# Patient Record
Sex: Male | Born: 1980
Health system: Southern US, Community
[De-identification: ages and names within clinical notes are randomized; demographics above are authoritative.]

## PROBLEM LIST (undated history)

## (undated) DIAGNOSIS — K219 Gastro-esophageal reflux disease without esophagitis: Secondary | ICD-10-CM

## (undated) DIAGNOSIS — H01139 Eczematous dermatitis of unspecified eye, unspecified eyelid: Secondary | ICD-10-CM

## (undated) DIAGNOSIS — T7840XA Allergy, unspecified, initial encounter: Secondary | ICD-10-CM

## (undated) HISTORY — PX: OTHER SURGICAL HISTORY: SHX169

## (undated) HISTORY — DX: Gastro-esophageal reflux disease without esophagitis: K21.9

## (undated) HISTORY — DX: Allergy, unspecified, initial encounter: T78.40XA

## (undated) HISTORY — DX: Eczematous dermatitis of unspecified eye, unspecified eyelid: H01.139

## (undated) HISTORY — PX: WISDOM TOOTH EXTRACTION: SHX21

---

## 2007-10-08 ENCOUNTER — Emergency Department (HOSPITAL_COMMUNITY): Admission: EM | Admit: 2007-10-08 | Discharge: 2007-10-08 | Payer: Self-pay | Admitting: Pediatrics

## 2010-10-07 LAB — URINE MICROSCOPIC-ADD ON

## 2010-10-07 LAB — URINALYSIS, ROUTINE W REFLEX MICROSCOPIC
Bilirubin Urine: NEGATIVE
Ketones, ur: NEGATIVE
Specific Gravity, Urine: 1.01
Urobilinogen, UA: 0.2

## 2010-10-07 LAB — DIFFERENTIAL
Eosinophils Absolute: 0.1
Eosinophils Relative: 1
Lymphocytes Relative: 8 — ABNORMAL LOW
Lymphs Abs: 1
Monocytes Absolute: 0.7
Monocytes Relative: 6
Neutrophils Relative %: 85 — ABNORMAL HIGH

## 2010-10-07 LAB — BASIC METABOLIC PANEL
BUN: 12
Calcium: 8.9
Potassium: 4.1
Sodium: 137

## 2010-10-07 LAB — CBC
HCT: 43.7
MCV: 87.9
Platelets: 192
WBC: 11.7 — ABNORMAL HIGH

## 2011-07-24 ENCOUNTER — Ambulatory Visit (INDEPENDENT_AMBULATORY_CARE_PROVIDER_SITE_OTHER): Payer: Managed Care, Other (non HMO) | Admitting: Family Medicine

## 2011-07-24 ENCOUNTER — Encounter: Payer: Self-pay | Admitting: Family Medicine

## 2011-07-24 VITALS — BP 136/74 | HR 77 | Resp 18 | Ht 70.0 in | Wt 157.0 lb

## 2011-07-24 DIAGNOSIS — Z0000 Encounter for general adult medical examination without abnormal findings: Secondary | ICD-10-CM

## 2011-07-24 DIAGNOSIS — R197 Diarrhea, unspecified: Secondary | ICD-10-CM

## 2011-07-24 DIAGNOSIS — R109 Unspecified abdominal pain: Secondary | ICD-10-CM

## 2011-07-24 DIAGNOSIS — R110 Nausea: Secondary | ICD-10-CM

## 2011-07-24 NOTE — Progress Notes (Signed)
  Subjective:    Patient ID: Joshua Ho, male    DOB: 04/23/1980, 31 y.o.   MRN: [id number]  HPI Pt here to establish care/ physical  previous PCP [facility] Medications and history reviewed He has been having nausea with dizzy episodes for the the past year, nausea  Accompanied by an "upset stomach" but no severe abdominal pain. The nausea gets so bad he becomes dizzy,no LOC, no seizure activity. He has been told this is GERD and prescribed prevacid but states he does not have heartburn. For the past 2 months he has had diarrhea, no blood in stool. No labs have every been done, no imaging. 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 He did drink and smoke > 5 years ago  TDAP- given 2005 after cut to finger   Review of Systems   GEN- denies fatigue, fever, weight loss,weakness, recent illness HEENT- denies eye drainage, change in vision, nasal discharge, CVS- denies chest pain, palpitations RESP- denies SOB, cough, wheeze ABD- + N/ no V, +change in stools, abd pain GU- denies dysuria, hematuria, dribbling, incontinence MSK- denies joint pain, muscle aches, injury Neuro- denies headache, dizziness, syncope, seizure activity      Objective:   Physical Exam  GEN- NAD, alert and oriented x3 HEENT- PERRL, EOMI, non injected sclera, pink conjunctiva, MMM, oropharynx clear Neck- Supple,  CVS- RRR, no murmur RESP-CTAB ABD-NABS,soft, NT,ND, no HSM EXT- No edema Pulses- Radial, DP- 2+ Skin- tatoo across abdomen Neuro- CNII-XII in tact, no focal deficits Psych-normal affect and Mood     Assessment & Plan:

## 2011-07-24 NOTE — Patient Instructions (Signed)
I will call with results If negative work up you will be referred to gastroenterologist Continue prevacid  F/U as needed, at least once a year for physical

## 2011-07-25 LAB — COMPREHENSIVE METABOLIC PANEL
ALT: 33 U/L (ref 0–53)
AST: 25 U/L (ref 0–37)
BUN: 15 mg/dL (ref 6–23)
Calcium: 9.5 mg/dL (ref 8.4–10.5)
Chloride: 103 mEq/L (ref 96–112)
Creat: 0.92 mg/dL (ref 0.50–1.35)
Potassium: 3.8 mEq/L (ref 3.5–5.3)
Sodium: 140 mEq/L (ref 135–145)
Total Bilirubin: 0.7 mg/dL (ref 0.3–1.2)

## 2011-07-25 LAB — CBC
MCH: 28.4 pg (ref 26.0–34.0)
MCHC: 34.7 g/dL (ref 30.0–36.0)
RBC: 5.45 MIL/uL (ref 4.22–5.81)
WBC: 11.7 10*3/uL — ABNORMAL HIGH (ref 4.0–10.5)

## 2011-07-25 LAB — LIPASE: Lipase: 36 U/L (ref 0–75)

## 2011-07-25 LAB — LIPID PANEL: VLDL: 13 mg/dL (ref 0–40)

## 2011-07-26 ENCOUNTER — Encounter: Payer: Self-pay | Admitting: Family Medicine

## 2011-07-26 DIAGNOSIS — R197 Diarrhea, unspecified: Secondary | ICD-10-CM | POA: Insufficient documentation

## 2011-07-26 DIAGNOSIS — R110 Nausea: Secondary | ICD-10-CM | POA: Insufficient documentation

## 2011-07-26 DIAGNOSIS — Z0000 Encounter for general adult medical examination without abnormal findings: Secondary | ICD-10-CM | POA: Insufficient documentation

## 2011-07-26 NOTE — Assessment & Plan Note (Signed)
He has been dealing with nausea episodes that cause him to be dizzy at times, prevacid has helped some but does not control the problem. He has some foods he is aware of to stay away from. For now will check labs, H pylori to be checked as well and lipase for pancreatitis. Without pain today, I will hold on imaging, he will likley need GI referral

## 2011-07-26 NOTE — Assessment & Plan Note (Signed)
No significant weight loss, diarrhea x 2 months, that with nausea, will need GI referral if pt agrees, no recent antibiotics to suggest infection or travel.

## 2011-07-26 NOTE — Assessment & Plan Note (Signed)
Very well appearing male, vision screen normal, TDAP UTD, obtain labs- CMET, CBC, FLP

## 2011-07-27 ENCOUNTER — Other Ambulatory Visit: Payer: Self-pay | Admitting: Family Medicine

## 2011-07-27 LAB — H. PYLORI ANTIBODY, IGG: H Pylori IgG: 1.12 {ISR} — ABNORMAL HIGH

## 2011-07-27 MED ORDER — METRONIDAZOLE 500 MG PO TABS: 500.0000 mg | ORAL_TABLET | Freq: Two times a day (BID) | ORAL | Status: AC

## 2011-07-27 MED ORDER — CLARITHROMYCIN 500 MG PO TABS: 500.0000 mg | ORAL_TABLET | Freq: Two times a day (BID) | ORAL | Status: AC

## 2011-07-28 ENCOUNTER — Ambulatory Visit: Payer: Self-pay | Admitting: Family Medicine

## 2011-07-28 ENCOUNTER — Other Ambulatory Visit: Payer: Self-pay

## 2011-07-28 MED ORDER — LANSOPRAZOLE 30 MG PO CPDR: 30.0000 mg | DELAYED_RELEASE_CAPSULE | Freq: Every day | ORAL | Status: AC

## 2011-12-28 ENCOUNTER — Telehealth: Payer: Self-pay

## 2011-12-28 MED ORDER — PREDNISONE 10 MG PO TABS: ORAL_TABLET | ORAL | Status: DC

## 2011-12-28 NOTE — Telephone Encounter (Signed)
Patient has been having swelling around his eyes - is allergic to wifes new perfume spray. Eyes swelled up and cannot miss work because he has been out since Dec 7. 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 Wants to know if there is a cream or something he can use around his eyes for this?

## 2011-12-28 NOTE — Telephone Encounter (Signed)
Pt has allergies to may perfumes, lotions etc. He is able to see, has swelling beneath eyes that happens when he comes in contact with allergens Prednisone 20mg  daily x 5 days Claritin daily

## 2011-12-28 NOTE — Telephone Encounter (Signed)
Pt aware.

## 2012-01-07 ENCOUNTER — Ambulatory Visit: Payer: Managed Care, Other (non HMO) | Admitting: Family Medicine

## 2012-01-07 ENCOUNTER — Telehealth: Payer: Self-pay | Admitting: Family Medicine

## 2012-01-07 NOTE — Telephone Encounter (Signed)
Needs appt

## 2012-01-07 NOTE — Telephone Encounter (Signed)
Pt has appt on 01/21/2012. i scheduled it with pt. He has no insurance and wife doesn't get there cards until next week. And dr. Jeanice Lim is out all next week. So i gave him the next time. Pt aware

## 2012-01-07 NOTE — Telephone Encounter (Signed)
Agree, with above, he needs to be seen

## 2012-01-21 ENCOUNTER — Ambulatory Visit (INDEPENDENT_AMBULATORY_CARE_PROVIDER_SITE_OTHER): Payer: 59 | Admitting: Family Medicine

## 2012-01-21 ENCOUNTER — Encounter: Payer: Self-pay | Admitting: Family Medicine

## 2012-01-21 VITALS — BP 124/70 | HR 100 | Resp 18 | Ht 70.0 in | Wt 162.0 lb

## 2012-01-21 DIAGNOSIS — R197 Diarrhea, unspecified: Secondary | ICD-10-CM

## 2012-01-21 DIAGNOSIS — L259 Unspecified contact dermatitis, unspecified cause: Secondary | ICD-10-CM

## 2012-01-21 MED ORDER — METHYLPREDNISOLONE ACETATE 40 MG/ML IJ SUSP
40.0000 mg | Freq: Once | INTRAMUSCULAR | Status: AC
  Administered 2012-01-21: 40 mg via INTRAMUSCULAR

## 2012-01-21 MED ORDER — LEVOCETIRIZINE DIHYDROCHLORIDE 5 MG PO TABS: 5.0000 mg | ORAL_TABLET | Freq: Every evening | ORAL | Status: DC

## 2012-01-21 MED ORDER — PREDNISONE 10 MG PO TABS: ORAL_TABLET | ORAL | Status: DC

## 2012-01-21 NOTE — Patient Instructions (Addendum)
Depo Medrol shot given Start prednisone tomorrow Start anti-histamine  Referral to the allergist  F/U in 6 months for physical

## 2012-01-22 ENCOUNTER — Encounter: Payer: Self-pay | Admitting: Family Medicine

## 2012-01-22 DIAGNOSIS — L259 Unspecified contact dermatitis, unspecified cause: Secondary | ICD-10-CM | POA: Insufficient documentation

## 2012-01-22 NOTE — Assessment & Plan Note (Signed)
He appears to be getting a contact dermatitis of some sort around the face. I will start him on xyzal and he will also be getting a dose of Depo-Medrol today followed by prednisone taper as this actually cleared up all of his symptoms a couple weeks ago. If this returns he agrees to be seen by dermatology for further testing on allergies He can use a dab of 1% hydrocortisone mixed with vaseline near the brows and above the eyes for severe itching

## 2012-01-22 NOTE — Assessment & Plan Note (Signed)
His diarrhea today is a very acute setting may be related something he will monitor this he will call if he has pain or develops any other symptoms such as fever. He would drink plenty of fluids

## 2012-01-22 NOTE — Progress Notes (Signed)
  Subjective:    Patient ID: Joshua Ho, male    DOB: February 28, 1980, 32 y.o.   MRN: [id number]  HPI  Patient presents with recurrent eyelid swelling (worse in the morning) and itching around eyes. 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 Over the past couple weeks he's had swelling around his eyelids and eyes after being in contact with various soaps and lotions. He also thought he may have been allergic to his wife's makeup as his skin is very sensitive. He's tried Benadryl at home he was even given a five-day burst of prednisone which actually cleared up all of his symptoms however they returned after coming contact with the type of face wash. In the past she's had problems with different detergents which he would get hives and bumps on his back and chest. He denies any difficulty breathing there no specific foods that cause any problems.  His previous nausea and diarrhea resolved after treatment for H. pylori he did however begin to have diarrhea about 2 hours ago after leaving the mall he is unsure if it was due to what he 8 at a [employer]. Denies any fever or abdominal pain.  Review of Systems  -  Per above    GEN- denies fatigue, fever, weight loss,weakness, recent illness HEENT- denies eye drainage, change in vision, nasal discharge, CVS- denies chest pain, palpitations RESP- denies SOB, cough, wheeze ABD- denies N/V, +change in stools, abd pain GU- denies dysuria, hematuria, dribbling, incontinence MSK- denies joint pain, muscle aches, injury Neuro- denies headache, dizziness, syncope, seizure activity      Objective:   Physical Exam  GEN- NAD, alert and oriented x3 HEENT- PERRL, EOMI, non injected sclera, pink conjunctiva, MMM, oropharynx clear,vision intact  Neck- Supple, no LAD CVS- RRR, no murmur RESP-CTAB ABD-NABS,soft,NT,ND EXT- No edema Pulses- Radial, DP- 2+ Skin- erythema with dryness across upper eyelids , no fluctuant areas, mild  swelling upper eye lid, dry skin in bearded region, drunk spared      Assessment & Plan:

## 2012-02-11 ENCOUNTER — Other Ambulatory Visit: Payer: Self-pay | Admitting: Family Medicine

## 2012-02-11 DIAGNOSIS — H01119 Allergic dermatitis of unspecified eye, unspecified eyelid: Secondary | ICD-10-CM

## 2012-02-11 DIAGNOSIS — Z9109 Other allergy status, other than to drugs and biological substances: Secondary | ICD-10-CM

## 2012-02-11 MED ORDER — OLOPATADINE HCL 0.2 % OP SOLN: OPHTHALMIC | Status: DC

## 2012-02-11 NOTE — Progress Notes (Signed)
Patient ID: Joshua Ho, male   DOB: 24-Sep-1980, 32 y.o.   MRN: [id number] Patient's wife called in his behalf he still having swelling of his eyes itching and watering. All his symptoms resolved however they return he's been taking antihistamine as well as Benadryl and this does not help in his eyes I will send him over [place] and get him to an allergist to see what is triggering his symptoms they agree with plan

## 2012-02-22 ENCOUNTER — Telehealth: Payer: Self-pay | Admitting: Family Medicine

## 2012-02-25 ENCOUNTER — Telehealth: Payer: Self-pay | Admitting: Family Medicine

## 2012-02-25 NOTE — Telephone Encounter (Signed)
Patient is aware 

## 2012-03-10 ENCOUNTER — Telehealth: Payer: Self-pay | Admitting: Family Medicine

## 2012-03-10 NOTE — Telephone Encounter (Signed)
Wanted to know if he started having to get weekly allergy shots if we could do it here because at [facility] [place] it was a $40 copay each time. I told her we could

## 2012-03-10 NOTE — Telephone Encounter (Signed)
Please call and triage call

## 2012-04-25 ENCOUNTER — Other Ambulatory Visit: Payer: Self-pay | Admitting: Family Medicine

## 2012-05-22 ENCOUNTER — Other Ambulatory Visit: Payer: Self-pay | Admitting: Family Medicine

## 2012-05-23 ENCOUNTER — Other Ambulatory Visit: Payer: Self-pay | Admitting: Family Medicine

## 2012-06-16 ENCOUNTER — Ambulatory Visit (INDEPENDENT_AMBULATORY_CARE_PROVIDER_SITE_OTHER): Payer: 59 | Admitting: Family Medicine

## 2012-06-16 DIAGNOSIS — Z23 Encounter for immunization: Secondary | ICD-10-CM

## 2012-08-18 ENCOUNTER — Telehealth: Payer: Self-pay | Admitting: Family Medicine

## 2012-08-19 MED ORDER — PANTOPRAZOLE SODIUM 40 MG PO TBEC: 40.0000 mg | DELAYED_RELEASE_TABLET | Freq: Every day | ORAL | Status: DC

## 2012-08-19 NOTE — Telephone Encounter (Signed)
Is there something else ?

## 2012-08-19 NOTE — Telephone Encounter (Signed)
Please let pt know protonix has been called, in take once a day 30 minutes before meal

## 2012-08-19 NOTE — Telephone Encounter (Signed)
Pt wife Morrie Sheldon aware

## 2012-10-07 ENCOUNTER — Telehealth: Payer: Self-pay | Admitting: Family Medicine

## 2012-10-07 ENCOUNTER — Ambulatory Visit (INDEPENDENT_AMBULATORY_CARE_PROVIDER_SITE_OTHER): Payer: 59 | Admitting: Family Medicine

## 2012-10-07 ENCOUNTER — Encounter: Payer: Self-pay | Admitting: Family Medicine

## 2012-10-07 VITALS — BP 120/80 | HR 78 | Temp 97.5°F | Resp 18 | Wt 166.0 lb

## 2012-10-07 DIAGNOSIS — J029 Acute pharyngitis, unspecified: Secondary | ICD-10-CM

## 2012-10-07 DIAGNOSIS — K120 Recurrent oral aphthae: Secondary | ICD-10-CM

## 2012-10-07 LAB — RAPID STREP SCREEN (MED CTR MEBANE ONLY): Streptococcus, Group A Screen (Direct): NEGATIVE

## 2012-10-07 MED ORDER — HYDROCODONE-ACETAMINOPHEN 5-325 MG PO TABS: 1.0000 | ORAL_TABLET | Freq: Four times a day (QID) | ORAL | Status: DC | PRN

## 2012-10-07 MED ORDER — FIRST-DUKES MOUTHWASH MT SUSP: OROMUCOSAL | Status: DC

## 2012-10-07 NOTE — Telephone Encounter (Signed)
Pt called.  Will be here 2pm per provider

## 2012-10-07 NOTE — Progress Notes (Signed)
  Subjective:    Patient ID: Joshua Ho, male    DOB: December 24, 1980, 32 y.o.   MRN: [id number]  HPI  Pt here with sore throat for past 48 hours, recently had wisdom teeth taking out was on Keflex 500mg  QID x 7 days And pain medication. Denies any fever, sinuses, URI symptoms. Very painful to swallow, feels like throat is raw. Yesterday noticed blisters on back of throat   Review of Systems  GEN- denies fatigue, fever, weight loss,weakness, recent illness HEENT- denies eye drainage, change in vision, nasal discharge,+ sore throat CVS- denies chest pain, palpitations RESP- denies SOB, cough, wheeze Neuro- denies headache, dizziness, syncope, seizure activity      Objective:   Physical Exam GEN- NAD, alert and oriented x3 HEENT- PERRL, EOMI, non injected sclera, pink conjunctiva, MMM, oropharynx +injection, with gray based shallow ulcerations on Uvula and post oropaharynx/ hard palate, no abscess seen , TM clear bilat no effusion,  No maxillary sinus tenderness Neck- Supple, no LAD CVS- RRR, no murmur RESP-CTAB EXT- No edema Pulses- Radial 2+         Assessment & Plan:

## 2012-10-07 NOTE — Telephone Encounter (Signed)
No appointments for today.  Husband is sick with severe sore throat.  Know he has Strep.  Can you call something in?

## 2012-10-07 NOTE — Patient Instructions (Signed)
You have apthous ulcers, these are benign Use the mouthwash  And pain meds Call if no improvement by Monday

## 2012-10-07 NOTE — Telephone Encounter (Signed)
He would need to be seen.he can come at 2pm

## 2012-10-09 DIAGNOSIS — K120 Recurrent oral aphthae: Secondary | ICD-10-CM | POA: Insufficient documentation

## 2012-10-09 NOTE — Assessment & Plan Note (Signed)
Strep neg Ulcerations appear to be body's recent reaction to dental surgery/stress Recent antibiotics Given dukes mouthwash and hydrocodone

## 2013-03-31 ENCOUNTER — Telehealth: Payer: Self-pay | Admitting: Family Medicine

## 2013-03-31 MED ORDER — PANTOPRAZOLE SODIUM 40 MG PO TBEC: 40.0000 mg | DELAYED_RELEASE_TABLET | Freq: Every day | ORAL | Status: DC

## 2013-03-31 NOTE — Telephone Encounter (Signed)
Refill appropriate and filled per protocol.  Call placed to patient and patient made aware per VM.

## 2013-03-31 NOTE — Telephone Encounter (Signed)
Pharmacy [facility] Call back number is [phone] Pt is needing a refill on pantoprazole (PROTONIX) 40 MG tablet

## 2013-04-06 ENCOUNTER — Other Ambulatory Visit: Payer: Self-pay | Admitting: Family Medicine

## 2013-04-06 NOTE — Telephone Encounter (Signed)
Refill appropriate and filled per protocol.  Patient is switching pharmacies.

## 2013-04-11 ENCOUNTER — Encounter: Payer: 59 | Admitting: Family Medicine

## 2013-04-26 ENCOUNTER — Encounter: Payer: Self-pay | Admitting: Family Medicine

## 2013-04-26 ENCOUNTER — Ambulatory Visit (INDEPENDENT_AMBULATORY_CARE_PROVIDER_SITE_OTHER): Payer: 59 | Admitting: Family Medicine

## 2013-04-26 VITALS — BP 108/62 | HR 76 | Temp 98.4°F | Resp 14 | Ht 69.5 in | Wt 167.0 lb

## 2013-04-26 DIAGNOSIS — K219 Gastro-esophageal reflux disease without esophagitis: Secondary | ICD-10-CM

## 2013-04-26 DIAGNOSIS — Z0000 Encounter for general adult medical examination without abnormal findings: Secondary | ICD-10-CM

## 2013-04-26 LAB — CBC WITH DIFFERENTIAL/PLATELET
Basophils Absolute: 0 10*3/uL (ref 0.0–0.1)
Basophils Relative: 1 % (ref 0–1)
EOS PCT: 3 % (ref 0–5)
Eosinophils Absolute: 0.1 10*3/uL (ref 0.0–0.7)
HEMATOCRIT: 42.8 % (ref 39.0–52.0)
Hemoglobin: 15 g/dL (ref 13.0–17.0)
LYMPHS ABS: 2 10*3/uL (ref 0.7–4.0)
LYMPHS PCT: 42 % (ref 12–46)
MCH: 28.6 pg (ref 26.0–34.0)
MCHC: 35 g/dL (ref 30.0–36.0)
MCV: 81.5 fL (ref 78.0–100.0)
MONO ABS: 0.5 10*3/uL (ref 0.1–1.0)
Monocytes Relative: 11 % (ref 3–12)
NEUTROS ABS: 2.1 10*3/uL (ref 1.7–7.7)
Neutrophils Relative %: 43 % (ref 43–77)
Platelets: 215 10*3/uL (ref 150–400)
RBC: 5.25 MIL/uL (ref 4.22–5.81)
RDW: 12.9 % (ref 11.5–15.5)
WBC: 4.8 10*3/uL (ref 4.0–10.5)

## 2013-04-26 LAB — COMPREHENSIVE METABOLIC PANEL
ALT: 28 U/L (ref 0–53)
AST: 28 U/L (ref 0–37)
Albumin: 4.4 g/dL (ref 3.5–5.2)
Alkaline Phosphatase: 54 U/L (ref 39–117)
BUN: 17 mg/dL (ref 6–23)
CALCIUM: 9.2 mg/dL (ref 8.4–10.5)
CHLORIDE: 102 meq/L (ref 96–112)
CO2: 27 mEq/L (ref 19–32)
Creat: 0.9 mg/dL (ref 0.50–1.35)
GLUCOSE: 88 mg/dL (ref 70–99)
Potassium: 3.9 mEq/L (ref 3.5–5.3)
Sodium: 139 mEq/L (ref 135–145)
TOTAL PROTEIN: 7.1 g/dL (ref 6.0–8.3)
Total Bilirubin: 0.8 mg/dL (ref 0.2–1.2)

## 2013-04-26 LAB — LIPID PANEL
CHOL/HDL RATIO: 4 ratio
Cholesterol: 169 mg/dL (ref 0–200)
HDL: 42 mg/dL (ref >39)
LDL Cholesterol: 117 mg/dL — ABNORMAL HIGH (ref 0–99)
TRIGLYCERIDES: 48 mg/dL (ref <150)
VLDL: 10 mg/dL (ref 0–40)

## 2013-04-26 MED ORDER — PANTOPRAZOLE SODIUM 40 MG PO TBEC
DELAYED_RELEASE_TABLET | ORAL | Status: DC
Start: 2013-04-26 — End: 2013-07-10

## 2013-04-26 NOTE — Patient Instructions (Signed)
I recommend eye visit once a year I recommend dental visit every 6 months Goal is to  Exercise 30 minutes 5 days a week We will send a letter with lab results  F/U 1 year

## 2013-04-26 NOTE — Progress Notes (Signed)
Patient ID: Joshua Ho, male   DOB: 07/18/1980, 33 y.o.   MRN: [id number]   Subjective:    Patient ID: Joshua Ho, male    DOB: 05/29/1980, 33 y.o.   MRN: [id number]  Patient presents for CPE  Here for complete physical exam he is a specific concerns today. He still uses a cream given by dermatology for his atopic dermatitis of the eyelids as well as over-the-counter seasonal allergy medicine. He continues to take his Protonix as needed for acid reflux. He's working on a regular basis and eating very healthy. He is due for repeat labs. He had a very minimally elevated LDL about 2 years ago. His immunizations are up-to-date   Review Of Systems:  GEN- denies fatigue, fever, weight loss,weakness, recent illness HEENT- denies eye drainage, change in vision, nasal discharge, CVS- denies chest pain, palpitations RESP- denies SOB, cough, wheeze ABD- denies N/V, change in stools, abd pain GU- denies dysuria, hematuria, dribbling, incontinence MSK- denies joint pain, muscle aches, injury Neuro- denies headache, dizziness, syncope, seizure activity       Objective:    BP 108/62  Pulse 76  Temp(Src) 98.4 F (36.9 C) (Oral)  Resp 14  Ht 5' 9.5" (1.765 m)  Wt 167 lb (75.751 kg)  BMI 24.32 kg/m2 GEN- NAD, alert and oriented x3 HEENT- PERRL, EOMI, non injected sclera, pink conjunctiva, MMM, oropharynx clear,TM Clear bilat, nares clear Neck- Supple, no LAD CVS- RRR, no murmur RESP-CTAB ABD-NABS,soft,NT,ND EXT- No edema Pulses- Radial, DP- 2+        Assessment & Plan:      Problem List Items Addressed This Visit   Routine general medical examination at a health care facility - Primary     CPE done Immunizations updated Fasting labs done    Relevant Orders      CBC with Differential      Comprehensive metabolic panel      Lipid panel   GERD (gastroesophageal reflux disease)     CONTINUE PROTONIX    Relevant Medications      pantoprazole (PROTONIX) EC tablet      Note:  This dictation was prepared with Dragon dictation along with smaller phrase technology. Any transcriptional errors that result from this process are unintentional.

## 2013-04-26 NOTE — Assessment & Plan Note (Signed)
CPE done Immunizations updated Fasting labs done

## 2013-04-26 NOTE — Assessment & Plan Note (Signed)
CONTINUE PROTONIX

## 2013-04-27 ENCOUNTER — Encounter: Payer: Self-pay | Admitting: *Deleted

## 2013-07-10 ENCOUNTER — Encounter: Payer: Self-pay | Admitting: Family Medicine

## 2013-07-10 ENCOUNTER — Ambulatory Visit (INDEPENDENT_AMBULATORY_CARE_PROVIDER_SITE_OTHER): Payer: 59 | Admitting: Family Medicine

## 2013-07-10 VITALS — BP 126/78 | HR 64 | Temp 98.2°F | Resp 12 | Ht 68.0 in | Wt 162.0 lb

## 2013-07-10 DIAGNOSIS — L239 Allergic contact dermatitis, unspecified cause: Secondary | ICD-10-CM | POA: Insufficient documentation

## 2013-07-10 DIAGNOSIS — L259 Unspecified contact dermatitis, unspecified cause: Secondary | ICD-10-CM

## 2013-07-10 DIAGNOSIS — K219 Gastro-esophageal reflux disease without esophagitis: Secondary | ICD-10-CM

## 2013-07-10 MED ORDER — RANITIDINE HCL 150 MG PO TABS: 150.0000 mg | ORAL_TABLET | Freq: Two times a day (BID) | ORAL | Status: DC

## 2013-07-10 NOTE — Patient Instructions (Signed)
Zantac 150mg  twice a day Then try the dexilant Call if you need the dermatology

## 2013-07-10 NOTE — Assessment & Plan Note (Signed)
We will give him a trial off of the Protonix, I placed him on Zantac 150 mg twice a day if he does not notice any change with this with his rash and associated does not control his acid reflux symptoms have also given him a sample of Dexilant 60mg 

## 2013-07-10 NOTE — Progress Notes (Signed)
Patient ID: Joshua Ho, male   DOB: 07/20/1980, 33 y.o.   MRN: [id number]   Subjective:    Patient ID: Joshua Ho, male    DOB: 12/19/1980, 33 y.o.   MRN: [id number]  Patient presents for Discuss medication changes  Patient here secondary to allergic dermatitis. He has a history of allergic dermatitis which starts out in his face around his eyes and the cheek area he also gets a rash on his arms. This is been going on for a little over a year. He was seen by allergy specialist who did allergy testing showed that he has severe allergy to outdoor pollens and he was given Protopic to use on his face as well as antihistamine. He is concerned that it may have something to do with his reflux medication he thought he was started on protonic around the same time. I did review his chart he was on Prevacid when he came in with the initial symptoms he was then changed to protonix back in August. He was a trial off the medication and see if that is causing his symptoms but he does need something to help with his acid reflux which is severe   Review Of Systems:  GEN- denies fatigue, fever, weight loss,weakness, recent illness HEENT- denies eye drainage, change in vision, nasal discharge, CVS- denies chest pain, palpitations RESP- denies SOB, cough, wheeze ABD- denies N/V, change in stools, abd pain GU- denies dysuria, hematuria, dribbling, incontinence MSK- denies joint pain, muscle aches, injury Neuro- denies headache, dizziness, syncope, seizure activity       Objective:    BP 126/78  Pulse 64  Temp(Src) 98.2 F (36.8 C) (Oral)  Resp 12  Ht 5\' 8"  (1.727 m)  Wt 162 lb (73.483 kg)  BMI 24.64 kg/m2 GEN- NAD, alert and oriented x3 HEENT- PERRL, EOMI, non injected sclera, pink conjunctiva, MMM, oropharynx clear Skin- mild erythema of upper eyelids and cheeks, raised erythematous raised on left forearm Pulses- Radial, DP- 2+        Assessment & Plan:      Problem List Items Addressed This  Visit   None      Note: This dictation was prepared with Dragon dictation along with smaller phrase technology. Any transcriptional errors that result from this process are unintentional.

## 2013-07-10 NOTE — Assessment & Plan Note (Signed)
I think it is reasonable to give a trial off of the proton pump inhibitor we will give him Zantac which is an H2 blocker which may help his allergic response anyway. If this does not help the next couple be dermatology he will let me know if he was proceed with this referral

## 2013-08-23 ENCOUNTER — Telehealth: Payer: Self-pay | Admitting: Family Medicine

## 2013-08-23 ENCOUNTER — Ambulatory Visit (INDEPENDENT_AMBULATORY_CARE_PROVIDER_SITE_OTHER): Payer: 59 | Admitting: Physician Assistant

## 2013-08-23 ENCOUNTER — Encounter: Payer: Self-pay | Admitting: Physician Assistant

## 2013-08-23 VITALS — BP 110/78 | HR 68 | Temp 98.2°F | Resp 14 | Ht 70.0 in | Wt 160.0 lb

## 2013-08-23 DIAGNOSIS — R002 Palpitations: Secondary | ICD-10-CM

## 2013-08-23 DIAGNOSIS — R55 Syncope and collapse: Secondary | ICD-10-CM

## 2013-08-23 LAB — CBC WITH DIFFERENTIAL/PLATELET
BASOS ABS: 0 10*3/uL (ref 0.0–0.1)
Basophils Relative: 0 % (ref 0–1)
EOS ABS: 0.2 10*3/uL (ref 0.0–0.7)
EOS PCT: 3 % (ref 0–5)
HEMATOCRIT: 43.8 % (ref 39.0–52.0)
Hemoglobin: 15.3 g/dL (ref 13.0–17.0)
LYMPHS ABS: 1.9 10*3/uL (ref 0.7–4.0)
LYMPHS PCT: 35 % (ref 12–46)
MCH: 28.9 pg (ref 26.0–34.0)
MCHC: 34.9 g/dL (ref 30.0–36.0)
MCV: 82.8 fL (ref 78.0–100.0)
MONO ABS: 0.4 10*3/uL (ref 0.1–1.0)
MONOS PCT: 8 % (ref 3–12)
NEUTROS ABS: 2.9 10*3/uL (ref 1.7–7.7)
NEUTROS PCT: 54 % (ref 43–77)
Platelets: 220 10*3/uL (ref 150–400)
RBC: 5.29 MIL/uL (ref 4.22–5.81)
RDW: 13 % (ref 11.5–15.5)
WBC: 5.3 10*3/uL (ref 4.0–10.5)

## 2013-08-23 LAB — COMPLETE METABOLIC PANEL WITH GFR
ALK PHOS: 49 U/L (ref 39–117)
ALT: 24 U/L (ref 0–53)
AST: 19 U/L (ref 0–37)
Albumin: 4.2 g/dL (ref 3.5–5.2)
BILIRUBIN TOTAL: 0.8 mg/dL (ref 0.2–1.2)
BUN: 16 mg/dL (ref 6–23)
CALCIUM: 9 mg/dL (ref 8.4–10.5)
CHLORIDE: 105 meq/L (ref 96–112)
CO2: 26 meq/L (ref 19–32)
CREATININE: 1.01 mg/dL (ref 0.50–1.35)
GFR, Est African American: >89 mL/min
GLUCOSE: 91 mg/dL (ref 70–99)
POTASSIUM: 4.3 meq/L (ref 3.5–5.3)
SODIUM: 140 meq/L (ref 135–145)
Total Protein: 7.4 g/dL (ref 6.0–8.3)

## 2013-08-23 LAB — TSH: TSH: 1.652 u[IU]/mL (ref 0.350–4.500)

## 2013-08-23 MED ORDER — PANTOPRAZOLE SODIUM 40 MG PO TBEC: 40.0000 mg | DELAYED_RELEASE_TABLET | Freq: Every day | ORAL | Status: DC

## 2013-08-23 NOTE — Telephone Encounter (Signed)
Call placed to patient.   Was advised that trial period of zantac was given to see if Protonix was casing dermatitis.   Patient states that rash like irritation continues.   Requested to resume Protonix.   MD please advise.

## 2013-08-23 NOTE — Progress Notes (Signed)
Patient ID: Phillis HaggisJason G Blatt MRN: [id number], DOB: 08/12/1980, 33 y.o. Date of Encounter: @DATE @  Chief Complaint:  Chief Complaint  Patient presents with  . Dizziness    ?' blood sugar dropping  . Nausea    HPI: 33 y.o. year old white male  presents for evaluation of the below/ following symptoms:  He says that he has been having episodes of feeling lightheaded nauseous and shaking.  Initially he he and his wife say that they think that these episodes are because of low blood sugar. However later in the conversation he reports that there are other episodes that have occurred after he has just eaten.  Then they also bring him whether these symptoms are related to his GERD and H. Pylori. I then discussed the range of symptoms that most people have with H. pylori and GERD and the fact that really faint that all of these symptoms would be explained by those kinds of things.  He reports that one of these episodes occurred yesterday morning at work. Says that he works at [employer] 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 Says that he was doing nothing physically exertional. Says that yesterday morning he woke up at 6 AM to get ready for work got to work and about 8 AM felt lightheaded shaky and nauseous. He could feel his heart racing and pounding. He does note that he had not [place] prior to the time of the episode. However he also notes that the symptoms came on all of a sudden-- not a gradual weakness that she would expect with low sugar secondary to not eating. However he says that he then did eat something and then was feeling better about 30 minutes later.  Says that [place] around 6 [place] 7 [place] Just prior to going to [employer] (about one hour hour prior to symptoms/episode) he had eaten a peanut butter and jelly sandwich. [place] he felt nauseous and lightheaded and feel his heart racing.     Past  Medical History  Diagnosis Date  . GERD (gastroesophageal reflux disease)   . Atopic dermatitis of eyelid   . Allergy      Home Meds: Outpatient Prescriptions Prior to Visit  Medication Sig Dispense Refill  . loratadine (CLARITIN) 10 MG tablet Take 10 mg by mouth daily.      . Multiple Vitamin (MULTIVITAMIN) tablet Take 1 tablet by mouth daily.      . ranitidine (ZANTAC) 150 MG tablet Take 1 tablet (150 mg total) by mouth 2 (two) times daily.  60 tablet  2  . tacrolimus (PROTOPIC) 0.03 % ointment Apply topically 2 (two) times daily.       No facility-administered medications prior to visit.    Allergies:  Allergies  Allergen Reactions  . Penicillins Rash    History   Social History  . Marital Status: Married    Spouse Name: N/A    Number of Children: N/A  . Years of Education: N/A   Occupational History  . Not on file.   Social History Main Topics  . Smoking status: Former Smoker    Quit date: 07/11/2009  . Smokeless tobacco: Former NeurosurgeonUser     Comment: quit 2011  . Alcohol Use: No  . Drug Use: No  . Sexual Activity: Yes   Other Topics Concern  . Not on file   Social History Narrative  .  No narrative on file    Family History  Problem Relation Age of Onset  . Hyperlipidemia Father      Review of Systems:  See HPI for pertinent ROS. All other ROS negative.    Physical Exam: Blood pressure 110/78, pulse 68, temperature 98.2 F (36.8 C), temperature source Oral, resp. rate 14, height 5\' 10"  (1.778 m), weight 160 lb (72.576 kg)., Body mass index is 22.96 kg/(m^2). General: WNWD WM. Appears in no acute distress. Neck: Supple. No thyromegaly. No lymphadenopathy. Lungs: Clear bilaterally to auscultation without wheezes, rales, or rhonchi. Breathing is unlabored. Heart: RRR with S1 S2. No murmurs, rubs, or gallops. Abdomen: Soft, non-tender, non-distended with normoactive bowel sounds. No hepatomegaly. No rebound/guarding. No obvious abdominal  masses. Musculoskeletal:  Strength and tone normal for age. Extremities/Skin: Warm and dry. No edema.  Neuro: Alert and oriented X 3. Moves all extremities spontaneously. Gait is normal. CNII-XII grossly in tact. Psych:  Responds to questions appropriately with a normal affect.   EKG shows normal sinus rhythm 63 beats per minute,  nonspecific ST-T change  ASSESSMENT AND PLAN:  33 y.o. year old male with  1. Pre-syncope - CBC with Differential - COMPLETE METABOLIC PANEL WITH GFR- TSH -TSH 2. Palpitations - CBC with Differential - COMPLETE METABOLIC PANEL WITH GFR - TSH  EKG obtained with findings above. Will obtain labs. I planned to obtain a Holter monitor but after the patient had left , I was informed that he deferred Holter monitor--b/c he would not be able to return monitor to our office tomorrow--secondary to his work schedule. At this point I will followup these lab results and if these are normal then I will refer him to cardiology for an event monitor.  Signed, [address] [place], [place], [facility] 08/23/2013 10:54 AM

## 2013-08-23 NOTE — Telephone Encounter (Signed)
Okay  To go back to protonix 40mg  once a day

## 2013-08-23 NOTE — Telephone Encounter (Signed)
Prescription sent to pharmacy.   Call placed to patient and patient made aware per VM. 

## 2013-08-23 NOTE — Telephone Encounter (Signed)
[phone]  Pt is needing a refill on Protonix (acid reflux medication) he use to be on it and was switched to something else but the protonix works better  Pharmacy [facility]

## 2013-08-24 ENCOUNTER — Telehealth: Payer: Self-pay | Admitting: Family Medicine

## 2013-08-24 NOTE — Telephone Encounter (Signed)
No. Sinuses would not cause the symptoms he was having. He was having presyncope feeling like he was about to pass out. Also said he was feeling "heart pounding"

## 2013-08-24 NOTE — Telephone Encounter (Signed)
Please advise 

## 2013-08-24 NOTE — Telephone Encounter (Signed)
Patient would like to know if his dizziness could be coming from a sinus infection or sinus issues by chance  He would like a call back at [phone]

## 2013-08-25 NOTE — Telephone Encounter (Signed)
Call placed to patient and patient made aware per VM.  

## 2013-08-28 ENCOUNTER — Telehealth: Payer: Self-pay | Admitting: Family Medicine

## 2013-08-28 DIAGNOSIS — K219 Gastro-esophageal reflux disease without esophagitis: Secondary | ICD-10-CM

## 2013-08-28 NOTE — Telephone Encounter (Signed)
Patient would like to go see rehman in [place] for gastroenterology for acid reflux, could we make this appt without him coming in since he was in last week?[phone]

## 2013-08-30 NOTE — Telephone Encounter (Signed)
Pt requesting to see gastroenterologist for acid reflux at [facility] office,I  put order in for referral to GI, pt was seen on 08/2013.

## 2013-09-06 ENCOUNTER — Encounter (INDEPENDENT_AMBULATORY_CARE_PROVIDER_SITE_OTHER): Payer: Self-pay | Admitting: *Deleted

## 2013-09-13 ENCOUNTER — Encounter: Payer: Self-pay | Admitting: Physician Assistant

## 2013-09-20 ENCOUNTER — Ambulatory Visit (INDEPENDENT_AMBULATORY_CARE_PROVIDER_SITE_OTHER): Payer: 59 | Admitting: Family Medicine

## 2013-09-20 ENCOUNTER — Telehealth: Payer: Self-pay | Admitting: Family Medicine

## 2013-09-20 ENCOUNTER — Encounter: Payer: Self-pay | Admitting: Family Medicine

## 2013-09-20 VITALS — BP 118/78 | HR 60 | Temp 98.3°F | Resp 14 | Ht 69.0 in | Wt 165.0 lb

## 2013-09-20 DIAGNOSIS — Z23 Encounter for immunization: Secondary | ICD-10-CM

## 2013-09-20 DIAGNOSIS — K2970 Gastritis, unspecified, without bleeding: Secondary | ICD-10-CM | POA: Insufficient documentation

## 2013-09-20 DIAGNOSIS — K2990 Gastroduodenitis, unspecified, without bleeding: Secondary | ICD-10-CM

## 2013-09-20 DIAGNOSIS — R42 Dizziness and giddiness: Secondary | ICD-10-CM | POA: Insufficient documentation

## 2013-09-20 MED ORDER — SUCRALFATE 1 GM/10ML PO SUSP: 1.0000 g | Freq: Three times a day (TID) | ORAL | Status: DC

## 2013-09-20 MED ORDER — ONDANSETRON HCL 4 MG PO TABS: 4.0000 mg | ORAL_TABLET | Freq: Three times a day (TID) | ORAL | Status: DC | PRN

## 2013-09-20 NOTE — Assessment & Plan Note (Signed)
I do not understand the correlation between his nausea and the dizziness he does note he has had a few episodes of dizziness when he did not have the nausea and abdominal pain. There is question about when he is working out when he has these episodes we did advise a Holter monitor at the last visit however he wanted to hold off his labs were unremarkable including a TSH. Date most of his issues have to do with his abdomen may be some anxiety that is causing the other symptoms however if that does not pan out I would recommend that he have a least a Holter monitor done

## 2013-09-20 NOTE — Patient Instructions (Signed)
Try the  carafate with meals as needed for pain  Zofran for nausea Next step is monitor if nothing can be found with your stomach Flu shot given F/U as needed

## 2013-09-20 NOTE — Telephone Encounter (Signed)
Lupita Leash from dr setzer's office calling to speak with you  Regarding this patient  [phone]

## 2013-09-20 NOTE — Progress Notes (Signed)
Patient ID: Joshua Ho, male   DOB: 04-Jul-1980, 33 y.o.   MRN: [id number]   Subjective:    Patient ID: Joshua Ho, male    DOB: [birth year], 33 y.o.   MRN: [id number]  Patient presents for 4 week F/U  patient here for interim followup ear he was seen 4 weeks ago at that time was having nausea and dizzy spells typically they occur together he's even happen while working out. He thought it was due to the change in his proton pump inhibitor I changed him to Dexilant a couple of months ago he was previously on protoniX. he states that he feels like he did when he had H. pylori which I treated over a year ago. He denies any actual chest pain or palpitations he states that he didn't fill his heart racing one time he almost passed out. His wife is here today to states one episode occurred he had not had anything to eat for about 6 hours and he had been working out. He has discomfort when he eats therefore she's been eating very small meals. He does have appointment scheduled for gastroenterology in October. He has not had any change in his bowels no change emesis. No shortness of breath. He has felt better since he's been back on the protonix    Review Of Systems:  GEN- denies fatigue, fever, weight loss,weakness, recent illness HEENT- denies eye drainage, change in vision, nasal discharge, CVS- denies chest pain, palpitations RESP- denies SOB, cough, wheeze ABD- +N/ denies V, change in stools, +abd pain GU- denies dysuria, hematuria, dribbling, incontinence MSK- denies joint pain, muscle aches, injury Neuro- denies headache, +dizziness, syncope, seizure activity       Objective:    BP 118/78  Pulse 60  Temp(Src) 98.3 F (36.8 C) (Oral)  Resp 14  Ht  (1.753 m)  Wt 165 lb (74.844 kg)  BMI 24.36 kg/m2 GEN- NAD, alert and oriented x3 HEENT- PERRL, EOMI, non injected sclera, pink conjunctiva, MMM, oropharynx clear CVS- RRR, no murmur RESP-CTAB ABD-NABS,soft,NT,ND Neuro-CNII-XII in  tact no deificts EXT- No edema Pulses- Radial 2+        Assessment & Plan:      Problem List Items Addressed This Visit   None      Note: This dictation was prepared with Dragon dictation along with smaller phrase technology. Any transcriptional errors that result from this process are unintentional.

## 2013-09-20 NOTE — Assessment & Plan Note (Signed)
He was treated for H. Pylori back at the end of 2013 I will put him on sucralfate with meals hopefully this will help with his nausea and her appetite we will try to get up earlier GI appointment I think he would benefit from an EGD, to rule out ulcer

## 2013-09-20 NOTE — Telephone Encounter (Signed)
lmtrc

## 2013-10-09 ENCOUNTER — Ambulatory Visit (INDEPENDENT_AMBULATORY_CARE_PROVIDER_SITE_OTHER): Payer: 59 | Admitting: Internal Medicine

## 2013-10-09 ENCOUNTER — Encounter (INDEPENDENT_AMBULATORY_CARE_PROVIDER_SITE_OTHER): Payer: Self-pay | Admitting: *Deleted

## 2013-10-09 ENCOUNTER — Other Ambulatory Visit (INDEPENDENT_AMBULATORY_CARE_PROVIDER_SITE_OTHER): Payer: Self-pay | Admitting: *Deleted

## 2013-10-09 ENCOUNTER — Encounter (INDEPENDENT_AMBULATORY_CARE_PROVIDER_SITE_OTHER): Payer: Self-pay | Admitting: Internal Medicine

## 2013-10-09 VITALS — BP 112/60 | HR 60 | Temp 97.6°F | Ht 70.0 in | Wt 165.0 lb

## 2013-10-09 DIAGNOSIS — K219 Gastro-esophageal reflux disease without esophagitis: Secondary | ICD-10-CM

## 2013-10-09 NOTE — Progress Notes (Signed)
Subjective:    Patient ID: Joshua Ho, male    DOB: 1980/07/11, 33 y.o.   MRN: [id number]  HPI  Referred to our office by Dr. Buelah Manis for GERD/EGD. He says he has nausea and sometimes he becomes swimmy headed. He says it is his stomach. If he burps the dizziness resolves. He also c/o some burning in his epigastric region.  He was switched to Zantac 1 1/2 months ago, and taken off the Protonix. They thought maybe the Protonix was breaking her eyelids out. He started the Protonix again 3 weeks ago.  He says his symptoms of swimmy headedness is much better. He still has some nausea.  Has been on Protonix x 2 yrs. He is 85% better.  He still has some nausea and burning in his epigastric region. Onions and caffeine, and coffee aggravate his symptoms (nausea).  He avoid spicy foods because he knows they will bother him.  Appetite is good. No weight loss.  Usually has a BM daily in the am.  Stools are formed but really not solids.  No melena or BRRB. Hx of H.pylori and treated in 2013. States the Carafate is helping. No NSAIDs. .     07/24/2011 H. Pylori positive: Treated with Amoxicillin, Clarithromycin, and Prevacid BID x 10 days.  CBC    Component Value Date/Time   WBC 5.3 08/23/2013 0851   RBC 5.29 08/23/2013 0851   HGB 15.3 08/23/2013 0851   HCT 43.8 08/23/2013 0851   PLT 220 08/23/2013 0851   MCV 82.8 08/23/2013 0851   MCH 28.9 08/23/2013 0851   MCHC 34.9 08/23/2013 0851   RDW 13.0 08/23/2013 0851   LYMPHSABS 1.9 08/23/2013 0851   MONOABS 0.4 08/23/2013 0851   EOSABS 0.2 08/23/2013 0851   BASOSABS 0.0 08/23/2013 0851    CMP     Component Value Date/Time   NA 140 08/23/2013 0851   K 4.3 08/23/2013 0851   CL 105 08/23/2013 0851   CO2 26 08/23/2013 0851   GLUCOSE 91 08/23/2013 0851   BUN 16 08/23/2013 0851   CREATININE 1.01 08/23/2013 0851   CREATININE 1.11 10/08/2007 1940   CALCIUM 9.0 08/23/2013 0851   PROT 7.4 08/23/2013 0851   ALBUMIN 4.2 08/23/2013 0851   AST 19 08/23/2013 0851   ALT 24 08/23/2013 0851   ALKPHOS 49 08/23/2013 0851   BILITOT 0.8 08/23/2013 0851   GFRNONAA >89 08/23/2013 0851   GFRNONAA >60 10/08/2007 1940   GFRAA >89 08/23/2013 0851   GFRAA  Value: >60        The eGFR has been calculated using the MDRD equation. This calculation has not been validated in all clinical 10/08/2007 1940      Review of Systems Past Medical History  Diagnosis Date  . GERD (gastroesophageal reflux disease)   . Atopic dermatitis of eyelid   . Allergy     Past Surgical History  Procedure Laterality Date  . None    . Wisdom tooth extraction      x 4    Allergies  Allergen Reactions  . Penicillins Rash    Current Outpatient Prescriptions on File Prior to Visit  Medication Sig Dispense Refill  . loratadine (CLARITIN) 10 MG tablet Take 10 mg by mouth daily as needed.       . Multiple Vitamin (MULTIVITAMIN) tablet Take 1 tablet by mouth daily.      . ondansetron (ZOFRAN) 4 MG tablet Take 1 tablet (4 mg total) by mouth every 8 (eight)  hours as needed for nausea or vomiting.  20 tablet  1  . pantoprazole (PROTONIX) 40 MG tablet Take 1 tablet (40 mg total) by mouth daily.  30 tablet  3  . sucralfate (CARAFATE) 1 GM/10ML suspension Take 10 mLs (1 g total) by mouth 4 (four) times daily -  with meals and at bedtime.  420 mL  0  . tacrolimus (PROTOPIC) 0.03 % ointment Apply topically 2 (two) times daily.       No current facility-administered medications on file prior to visit.        Objective:   Physical Exam  Filed Vitals:   10/09/13 1501  BP: 112/60  Pulse: 60  Temp: 97.6 F (36.4 C)  Height: 5' 10"  (1.778 m)  Weight: 165 lb (74.844 kg)   Alert and oriented. Skin warm and dry. Oral mucosa is moist.   . Sclera anicteric, conjunctivae is pink. Thyroid not enlarged. No cervical lymphadenopathy. Lungs clear. Heart regular rate and rhythm.  Abdomen is soft. Bowel sounds are positive. No hepatomegaly. No abdominal masses felt. No tenderness.  No edema to lower  extremities.         Assessment & Plan:   GERD: PUD needs to be ruled out. Hx of H. Pylori positive. EGD IF EGD normal, GB disease needs to be ruled out.

## 2013-10-09 NOTE — Patient Instructions (Signed)
EGD. The risks and benefits such as perforation, bleeding, and infection were reviewed with the patient and is agreeable. 

## 2013-10-11 ENCOUNTER — Encounter (HOSPITAL_COMMUNITY): Payer: Self-pay | Admitting: Pharmacy Technician

## 2013-10-20 ENCOUNTER — Ambulatory Visit (HOSPITAL_COMMUNITY)
Admission: RE | Admit: 2013-10-20 | Discharge: 2013-10-20 | Disposition: A | Discharge status: Home / Self Care | Payer: 59 | Source: Ambulatory Visit | Attending: Internal Medicine | Admitting: Internal Medicine

## 2013-10-20 ENCOUNTER — Encounter (HOSPITAL_COMMUNITY): Payer: Self-pay | Admitting: *Deleted

## 2013-10-20 ENCOUNTER — Encounter (HOSPITAL_COMMUNITY)
Admission: RE | Disposition: A | Discharge status: Home / Self Care | Payer: Self-pay | Source: Ambulatory Visit | Attending: Internal Medicine

## 2013-10-20 DIAGNOSIS — R42 Dizziness and giddiness: Secondary | ICD-10-CM | POA: Insufficient documentation

## 2013-10-20 DIAGNOSIS — R110 Nausea: Secondary | ICD-10-CM | POA: Insufficient documentation

## 2013-10-20 DIAGNOSIS — R12 Heartburn: Secondary | ICD-10-CM

## 2013-10-20 DIAGNOSIS — Z79899 Other long term (current) drug therapy: Secondary | ICD-10-CM | POA: Insufficient documentation

## 2013-10-20 DIAGNOSIS — Z87891 Personal history of nicotine dependence: Secondary | ICD-10-CM | POA: Diagnosis not present

## 2013-10-20 DIAGNOSIS — K219 Gastro-esophageal reflux disease without esophagitis: Secondary | ICD-10-CM | POA: Insufficient documentation

## 2013-10-20 DIAGNOSIS — K228 Other specified diseases of esophagus: Secondary | ICD-10-CM

## 2013-10-20 HISTORY — PX: ESOPHAGOGASTRODUODENOSCOPY: SHX5428

## 2013-10-20 SURGERY — EGD (ESOPHAGOGASTRODUODENOSCOPY)
Anesthesia: Moderate Sedation

## 2013-10-20 MED ORDER — MIDAZOLAM HCL 5 MG/5ML IJ SOLN
INTRAMUSCULAR | Status: AC
  Filled 2013-10-20: qty 5

## 2013-10-20 MED ORDER — MIDAZOLAM HCL 5 MG/5ML IJ SOLN
INTRAMUSCULAR | Status: DC | PRN
  Administered 2013-10-20: 2 mg via INTRAVENOUS
  Administered 2013-10-20 (×2): 3 mg via INTRAVENOUS
  Administered 2013-10-20: 2 mg via INTRAVENOUS
  Administered 2013-10-20 (×2): 3 mg via INTRAVENOUS
  Administered 2013-10-20: 2 mg via INTRAVENOUS

## 2013-10-20 MED ORDER — MEPERIDINE HCL 50 MG/ML IJ SOLN
INTRAMUSCULAR | Status: AC
  Filled 2013-10-20: qty 1

## 2013-10-20 MED ORDER — MEPERIDINE HCL 50 MG/ML IJ SOLN
INTRAMUSCULAR | Status: DC | PRN
  Administered 2013-10-20 (×2): 25 mg via INTRAVENOUS

## 2013-10-20 MED ORDER — MIDAZOLAM HCL 5 MG/5ML IJ SOLN
INTRAMUSCULAR | Status: AC
  Filled 2013-10-20: qty 10

## 2013-10-20 MED ORDER — STERILE WATER FOR IRRIGATION IR SOLN
Status: DC | PRN
  Administered 2013-10-20: 09:00:00

## 2013-10-20 MED ORDER — SODIUM CHLORIDE 0.9 % IV SOLN
INTRAVENOUS | Status: DC
Start: 2013-10-20 — End: 2013-10-20
  Administered 2013-10-20: 08:00:00 via INTRAVENOUS

## 2013-10-20 MED ORDER — SUCRALFATE 1 GM/10ML PO SUSP: 2.0000 g | Freq: Every day | ORAL | Status: DC

## 2013-10-20 MED ORDER — BUTAMBEN-TETRACAINE-BENZOCAINE 2-2-14 % EX AERO
INHALATION_SPRAY | CUTANEOUS | Status: DC | PRN
  Administered 2013-10-20: 2 via TOPICAL

## 2013-10-20 NOTE — Discharge Instructions (Signed)
Resume usual medications as before but take sucralfate 2 g daily at bedtime. Resume usual diet. No driving for 24 hours. Physician will call with biopsy results. Keep symptom diary until office visit in 2 months.    Esophagogastroduodenoscopy Care After Refer to this sheet in the next few weeks. These instructions provide you with information on caring for yourself after your procedure. Your caregiver may also give you more specific instructions. Your treatment has been planned according to current medical practices, but problems sometimes occur. Call your caregiver if you have any problems or questions after your procedure.  HOME CARE INSTRUCTIONS  Do not eat or drink anything until the numbing medicine (local anesthetic) has worn off and your gag reflex has returned. You will know that the local anesthetic has worn off when you can swallow comfortably.  Do not drive for 12 hours after the procedure or as directed by your caregiver.  Only take medicines as directed by your caregiver. SEEK MEDICAL CARE IF:   You cannot stop coughing.  You are not urinating at all or less than usual. SEEK IMMEDIATE MEDICAL CARE IF:  You have difficulty swallowing.  You cannot eat or drink.  You have worsening throat or chest pain.  You have dizziness, lightheadedness, or you faint.  You have nausea or vomiting.  You have chills.  You have a fever.  You have severe abdominal pain.  You have black, tarry, or bloody stools. Document Released: 12/09/2011 Document Reviewed: 12/09/2011 [facility] Patient Information 2015 [place], [place]. This information is not intended to replace advice given to you by your health care provider. Make sure you discuss any questions you have with your health care provider.

## 2013-10-20 NOTE — Op Note (Signed)
EGD PROCEDURE REPORT  PATIENT:  Joshua Ho  MR#:  [id number] Birthdate:  04/03/1980, 33 y.o., male Endoscopist:  Dr. Malissa HippoNajeeb U. Rehman, MD Referred By:  Dr. Milinda AntisKawanta Cogswell, MD  Procedure Date: 10/20/2013  Procedure:   EGD  Indications:  Patient is a 33 year old Caucasian male with two-year history of intermittent nausea, sporadic heartburn and lightheadedness associated with nausea. He is feeling much better with PPI and sucralfate. He is undergoing diagnostic EGD.            Informed Consent:  The risks, benefits, alternatives & imponderables which include, but are not limited to, bleeding, infection, perforation, drug reaction and potential missed lesion have been reviewed.  The potential for biopsy, lesion removal, esophageal dilation, etc. have also been discussed.  Questions have been answered.  All parties agreeable.  Please see history & physical in medical record for more information.  Medications:  Demerol 50 mg IV Versed 18 mg IV Cetacaine spray topically for oropharyngeal anesthesia  Description of procedure:  The endoscope was introduced through the mouth and advanced to the second portion of the duodenum without difficulty or limitations. The mucosal surfaces were surveyed very carefully during advancement of the scope and upon withdrawal.  Findings:  Esophagus:  Mucosa of the esophagus was normal. GE junction was wavy or with focal sawtooth appearance. No erosions or ulcers noted. GEJ:  41 cm Stomach:  Stomach was empty and distended very well with insufflation. Folds in the proximal stomach were normal. Examination of mucosa at body, antrum, pyloric channel, angularis fundus and cardia was normal. Duodenum:  Normal bulbar and post bulbar mucosa.  Therapeutic/Diagnostic Maneuvers Performed:   Biopsy taken from GE junction, from the area of the sawtooth appearance.  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 Biopsy taken to rule out  Barretts esophagus. No evidence of erosive reflux esophagitis, gastritis or peptic ulcer disease.  Recommendations:  Continue anti-reflex measures. Continue pantoprazole at 40 mg by mouth every morning. Decrease sucralfate to 2 g by mouth each bedtime. I will be contacting patient with biopsy results. Patient will keep symptom diary until  office visit in 2 months.  REHMAN,NAJEEB U  10/20/2013  9:30 AM  CC: Dr. Milinda AntisURHAM, KAWANTA, MD & Dr. Bonnetta BarryNo ref. provider found

## 2013-10-20 NOTE — H&P (Signed)
Joshua HaggisJason G Ho is an 33 y.o. male.   Chief Complaint: Patient is here for EGD. HPI: Patient is 33 year old Caucasian male presents with two-year history of intermittent nausea associated with lightheadedness. He's never passed out. He has heartburn which is less frequent. He denies vomiting melena anorexia or weight loss. He has sporadic abdominal pain. Patient was initially on a PPI which was discontinued because there was question of allergies he developed intractable symptoms and he is back on PPI and a lot better. He does not take NSAIDs. He was treated for H. pylori in July 2013.  Past Medical History  Diagnosis Date  . GERD (gastroesophageal reflux disease)   . Atopic dermatitis of eyelid   . Allergy     Past Surgical History  Procedure Laterality Date  . None    . Wisdom tooth extraction      x 4    Family History  Problem Relation Age of Onset  . Hyperlipidemia Father    Social History:  reports that he quit smoking about 4 years ago. His smoking use included Cigarettes. He has a 4.5 pack-year smoking history. He has quit using smokeless tobacco. He reports that he does not drink alcohol or use illicit drugs.  Allergies:  Allergies  Allergen Reactions  . Penicillins Rash    Medications Prior to Admission  Medication Sig Dispense Refill  . ondansetron (ZOFRAN) 4 MG tablet Take 1 tablet (4 mg total) by mouth every 8 (eight) hours as needed for nausea or vomiting.  20 tablet  1  . pantoprazole (PROTONIX) 40 MG tablet Take 1 tablet (40 mg total) by mouth daily.  30 tablet  3  . sucralfate (CARAFATE) 1 GM/10ML suspension Take 10 mLs (1 g total) by mouth 4 (four) times daily -  with meals and at bedtime.  420 mL  0  . loratadine (CLARITIN) 10 MG tablet Take 10 mg by mouth daily as needed.       . Multiple Vitamin (MULTIVITAMIN) tablet Take 1 tablet by mouth daily.      . tacrolimus (PROTOPIC) 0.03 % ointment Apply 1 application topically 2 (two) times daily as needed.          No results found for this or any previous visit (from the past 48 hour(s)). No results found.  ROS  Blood pressure 132/75, pulse 73, temperature 97.9 F (36.6 C), temperature source Oral, resp. rate 14, height 5\' 10"  (1.778 m), weight 165 lb (74.844 kg), SpO2 100.00%. Physical Exam  Constitutional: He appears well-developed and well-nourished.  HENT:  Mouth/Throat: Oropharynx is clear and moist.  Eyes: Conjunctivae are normal. No scleral icterus.  Neck: No thyromegaly present.  Cardiovascular: Normal rate, regular rhythm and normal heart sounds.   No murmur heard. Respiratory: Effort normal and breath sounds normal.  GI: Soft. He exhibits no distension and no mass. There is no tenderness.  Musculoskeletal: He exhibits no edema.  Lymphadenopathy:    He has no cervical adenopathy.  Neurological: He is alert.  Skin: Skin is warm and dry.     Assessment/Plan Recurrent nausea and intermittent heartburn. Nausea associated with lightheadedness possibly vasovagal syndrome. Diagnostic EGD.  Tais Koestner U 10/20/2013, 8:49 AM

## 2013-10-25 ENCOUNTER — Encounter (HOSPITAL_COMMUNITY): Payer: Self-pay | Admitting: Internal Medicine

## 2013-11-03 ENCOUNTER — Encounter (INDEPENDENT_AMBULATORY_CARE_PROVIDER_SITE_OTHER): Payer: Self-pay | Admitting: *Deleted

## 2013-12-04 ENCOUNTER — Ambulatory Visit (INDEPENDENT_AMBULATORY_CARE_PROVIDER_SITE_OTHER): Payer: 59 | Admitting: Internal Medicine

## 2014-01-15 ENCOUNTER — Encounter (INDEPENDENT_AMBULATORY_CARE_PROVIDER_SITE_OTHER): Payer: Self-pay | Admitting: Internal Medicine

## 2014-01-15 ENCOUNTER — Ambulatory Visit (INDEPENDENT_AMBULATORY_CARE_PROVIDER_SITE_OTHER): Payer: 59 | Admitting: Internal Medicine

## 2014-01-15 VITALS — BP 122/74 | HR 68 | Temp 98.6°F | Resp 18 | Ht 70.0 in | Wt 168.5 lb

## 2014-01-15 DIAGNOSIS — K2100 Gastro-esophageal reflux disease with esophagitis, without bleeding: Secondary | ICD-10-CM

## 2014-01-15 NOTE — Patient Instructions (Signed)
Can use Gaviscon on as-needed basis. Notify if symptoms relapse.

## 2014-01-15 NOTE — Progress Notes (Signed)
Presenting complaint;  Follow-up for GERD.  Database;  Patient is 34 year old Caucasian male who presents for scheduled visit accompanied by his wife. Last year he was evaluated for intermittent nausea and heartburn epigastric pain and lightheadedness which usually was associated with nausea. He underwent EGD on 10/20/2013 revealing sawtooth appearance to GE junction. Biopsy was negative for Barrett's but did show changes of reflux esophagitis. Patient was advised to discontinue sucralfate but continue pantoprazole and use Gaviscon in the evening.  Subjective;  He feels much better. He rarely has heartburn and epigastric pain nausea or lightheadedness. He has changed his eating habits. He does not eat pizza or fried food often. He is not having any side effects with pantoprazole. His bowels move regularly and he denies melena or rectal bleeding.  Current Medications: Outpatient Encounter Prescriptions as of 01/15/2014  Medication Sig  . Alum Hydroxide-Mag Carbonate (GAVISCON PO) Take by mouth at bedtime.  Marland Kitchen. loratadine (CLARITIN) 10 MG tablet Take 10 mg by mouth daily as needed.   . Multiple Vitamin (MULTIVITAMIN) tablet Take 1 tablet by mouth daily.  . ondansetron (ZOFRAN) 4 MG tablet Take 1 tablet (4 mg total) by mouth every 8 (eight) hours as needed for nausea or vomiting.  . pantoprazole (PROTONIX) 40 MG tablet Take 1 tablet (40 mg total) by mouth daily.  . tacrolimus (PROTOPIC) 0.03 % ointment Apply 1 application topically 2 (two) times daily as needed.   . [DISCONTINUED] sucralfate (CARAFATE) 1 GM/10ML suspension Take 20 mLs (2 g total) by mouth at bedtime. (Patient not taking: Reported on 01/15/2014)     Objective: Blood pressure 122/74, pulse 68, temperature 98.6 F (37 C), temperature source Oral, resp. rate 18, height 5\' 10"  (1.778 m), weight 168 lb 8 oz (76.431 kg). Patient is alert and in no acute distress. Conjunctiva is pink. Sclera is nonicteric Oropharyngeal mucosa is  normal. No neck masses or thyromegaly noted. Cardiac exam with regular rhythm normal S1 and S2. No murmur or gallop noted. Lungs are clear to auscultation. Abdomen symmetrical soft and nontender without organomegaly or masses.  No LE edema or clubbing noted.   Assessment:  #1. Chronic GERD. Biopsy from GE junction revealed changes of reflux esophagitis. Symptom control appears to be satisfactory. #2. History of H. pylori gastritis. He was treated back in July 2013. EGD F October 2015 was negative for gastritis.   Plan:  Continue anti-reflex measures as before. Continue pantoprazole at 40 mg by mouth every morning. Patient advised to take it 30-60 minutes before breakfast. Use Gaviscon on as-needed basis. Office visit in 1 year.

## 2014-10-01 ENCOUNTER — Encounter (INDEPENDENT_AMBULATORY_CARE_PROVIDER_SITE_OTHER): Payer: Self-pay | Admitting: *Deleted

## 2014-12-10 ENCOUNTER — Ambulatory Visit (INDEPENDENT_AMBULATORY_CARE_PROVIDER_SITE_OTHER): Payer: 59 | Admitting: Family Medicine

## 2014-12-10 ENCOUNTER — Encounter: Payer: Self-pay | Admitting: Family Medicine

## 2014-12-10 VITALS — BP 114/68 | HR 72 | Temp 99.0°F | Resp 14 | Ht 70.0 in | Wt 160.0 lb

## 2014-12-10 DIAGNOSIS — J069 Acute upper respiratory infection, unspecified: Secondary | ICD-10-CM

## 2014-12-10 LAB — RAPID STREP SCREEN (MED CTR MEBANE ONLY): STREPTOCOCCUS, GROUP A SCREEN (DIRECT): NEGATIVE

## 2014-12-10 MED ORDER — AZITHROMYCIN 250 MG PO TABS: ORAL_TABLET | ORAL | Status: DC

## 2014-12-10 MED ORDER — GUAIFENESIN-CODEINE 100-10 MG/5ML PO SOLN: 5.0000 mL | Freq: Four times a day (QID) | ORAL | Status: DC | PRN

## 2014-12-10 NOTE — Patient Instructions (Signed)
Take cough medicine Use allergy spray If no improvement by end of week, start antibiotics F/U as needed

## 2014-12-10 NOTE — Progress Notes (Signed)
Patient ID: Joshua HaggisJason G Ho, male   DOB: 12/04/1980, 34 y.o.   MRN: [id number]   Subjective:    Patient ID: Joshua HaggisJason G Bickley, male    DOB: 06/09/1980, 34 y.o.   MRN: [id number]  Patient presents for Illness  patient here with sore throat fever chills headache congestion that started yesterday. He's been taking ibuprofen for the fever. He has history of severe allergies. He has been using Vicks decongestant. His wife is also been sick with a cold.    Review Of Systems:  GEN- denies fatigue,+ fever, weight loss,weakness, recent illness HEENT- denies eye drainage, change in vision, +nasal discharge, CVS- denies chest pain, palpitations RESP- denies SOB, +cough, wheeze ABD- denies N/V, change in stools, abd pain GU- denies dysuria, hematuria, dribbling, incontinence MSK- denies joint pain, muscle aches, injury Neuro- denies headache, dizziness, syncope, seizure activity       Objective:    BP 114/68 mmHg  Pulse 72  Temp(Src) 99 F (37.2 C) (Oral)  Resp 14  Ht 5\' 10"  (1.778 m)  Wt 160 lb (72.576 kg)  BMI 22.96 kg/m2 GEN- NAD, alert and oriented x3, non toxic appearing  HEENT- PERRL, EOMI, non injected sclera, pink conjunctiva, MMM, oropharynx mild injection, TM clear bilat no effusion, no  maxillary sinus tenderness, inflammed turbinates,  Nasal drainage  Neck- Supple, no LAD CVS- RRR, no murmur RESP-CTAB EXT- No edema Pulses- Radial 2+          Assessment & Plan:      Problem List Items Addressed This Visit    None    Visit Diagnoses    Viral URI    -  Primary    Viral illnes at this time, robitussin codiene, OTC nasal sprays, given zpak in case he is not better by end of week, with his history of allergies, sinus problems    Relevant Medications    azithromycin (ZITHROMAX) 250 MG tablet    Other Relevant Orders    Rapid Strep Screen (Completed)       Note: This dictation was prepared with Dragon dictation along with smaller phrase technology. Any transcriptional errors  that result from this process are unintentional.

## 2015-01-16 ENCOUNTER — Encounter (INDEPENDENT_AMBULATORY_CARE_PROVIDER_SITE_OTHER): Payer: Self-pay | Admitting: Internal Medicine

## 2015-01-16 ENCOUNTER — Ambulatory Visit (INDEPENDENT_AMBULATORY_CARE_PROVIDER_SITE_OTHER): Payer: 59 | Admitting: Internal Medicine

## 2015-01-16 VITALS — BP 121/62 | HR 65 | Temp 98.6°F | Ht 70.0 in | Wt 169.2 lb

## 2015-01-16 DIAGNOSIS — K219 Gastro-esophageal reflux disease without esophagitis: Secondary | ICD-10-CM

## 2015-01-16 NOTE — Patient Instructions (Signed)
OV in 1 year.  

## 2015-01-16 NOTE — Progress Notes (Addendum)
   Subjective:    Patient ID: Joshua HaggisJason G Ho, male    DOB: 03/18/1980, 35 y.o.   MRN: [id number]  HPI Here today for f/u. Hx of GERD. Presently not taking anything for his GERD. He tells me he is doing good. He occasionally has heart burn.  He is avoiding spicy foods or onions. He does drink caffinated drinks and coffee. There is no dizziness as long as he eats right. His appetite has remained good.  No nausea.  He has a BM daily. No melena or BRRB. He has a 6810 month old boy.      10/20/2013 EGD  Indications: Patient is a 35 year old Caucasian male with two-year history of intermittent nausea, sporadic heartburn and lightheadedness associated with nausea. He is feeling much better with PPI and sucralfate. He is undergoing diagnostic EGD.  Impression: Focal sawtooth appearance to GE junction. Biopsy taken to rule out Barretts esophagus. No evidence of erosive reflux esophagitis, gastritis or peptic ulcer disease.  Review of Systems Past Medical History  Diagnosis Date  . GERD (gastroesophageal reflux disease)   . Atopic dermatitis of eyelid   . Allergy     Past Surgical History  Procedure Laterality Date  . None    . Wisdom tooth extraction      x 4  . Esophagogastroduodenoscopy N/A 10/20/2013    Procedure: ESOPHAGOGASTRODUODENOSCOPY (EGD);  Surgeon: Malissa HippoNajeeb U Rehman, MD;  Location: [facility];  Service: Endoscopy;  Laterality: N/A;  855    Allergies  Allergen Reactions  . Penicillins Rash    Current Outpatient Prescriptions on File Prior to Visit  Medication Sig Dispense Refill  . Multiple Vitamin (MULTIVITAMIN) tablet Take 1 tablet by mouth as needed.      No current facility-administered medications on file prior to visit.        Objective:   Physical ExamBlood pressure 121/62, pulse 65, temperature 98.6 F (37 C), height 5\' 10"  (1.778 m), weight  169 lb 3.2 oz (76.749 kg). Alert and oriented. Skin warm and dry. Oral mucosa is moist.   . Sclera anicteric, conjunctivae is pink. Thyroid not enlarged. No cervical lymphadenopathy. Lungs clear. Heart regular rate and rhythm.  Abdomen is soft. Bowel sounds are positive. No hepatomegaly. No abdominal masses felt. No tenderness.  No edema to lower extremities.          Assessment & Plan:  GERD. No problems at this time.  GERD controlled with diet. I advised him if he had breakthru symptoms to let me know. Ov in 1 year. OV in 1 year.

## 2015-02-08 ENCOUNTER — Ambulatory Visit (INDEPENDENT_AMBULATORY_CARE_PROVIDER_SITE_OTHER): Payer: 59 | Admitting: Family Medicine

## 2015-02-08 ENCOUNTER — Encounter: Payer: Self-pay | Admitting: Family Medicine

## 2015-02-08 VITALS — BP 122/62 | HR 68 | Temp 99.4°F | Resp 14 | Ht 70.0 in | Wt 164.0 lb

## 2015-02-08 DIAGNOSIS — J0100 Acute maxillary sinusitis, unspecified: Secondary | ICD-10-CM

## 2015-02-08 MED ORDER — CEFDINIR 300 MG PO CAPS: 300.0000 mg | ORAL_CAPSULE | Freq: Two times a day (BID) | ORAL | Status: DC

## 2015-02-08 MED FILL — CEFDINIR 300 MG CAPSULE: 300 | 7 days supply | Qty: 14 | Fill #0

## 2015-02-08 NOTE — Progress Notes (Signed)
Patient ID: Joshua Ho, male   DOB: 09/25/1980, 35 y.o.   MRN: [id number]    Subjective:    Patient ID: Joshua Ho, male    DOB: 11-28-1980, 35 y.o.   MRN: [id number]  Patient presents for Illness   Patient here with head cold symptoms for the past few days. He has history of allergies and sinus issues. He has had thick yellow drainage from the nose he has had headache sore throat subjective fever. No chills no body aches. Occasional cough but mostly from the drainage. No known sick contacts. He stopping using anything over-the-counter    Review Of Systems:  GEN- denies fatigue, fever, weight loss,weakness, recent illness HEENT- denies eye drainage, change in vision, +nasal discharge, CVS- denies chest pain, palpitations RESP- denies SOB, +cough, wheeze ABD- denies N/V, change in stools, abd pain GU- denies dysuria, hematuria, dribbling, incontinence MSK- denies joint pain, muscle aches, injury Neuro- denies headache, dizziness, syncope, seizure activity       Objective:    BP 122/62 mmHg  Pulse 68  Temp(Src) 99.4 F (37.4 C) (Oral)  Resp 14  Ht  (1.778 m)  Wt 164 lb (74.39 kg)  BMI 23.53 kg/m2 GEN- NAD, alert and oriented x3 HEENT- PERRL, EOMI, non injected sclera, pink conjunctiva, MMM, oropharynx mild injection, TM clear bilat no effusion,  + maxillary sinus tenderness, inflammed turbinates,  Nasal drainage  Neck- Supple, + anterior  LAD CVS- RRR, no murmur RESP-CTAB EXT- No edema Pulses- Radial 2+         Assessment & Plan:      Problem List Items Addressed This Visit    None    Visit Diagnoses    Acute maxillary sinusitis, recurrence not specified    -  Primary     nasal saline / steroid/antibiotic    Relevant Medications    cefdinir (OMNICEF) 300 MG capsule       Note: This dictation was prepared with Dragon dictation along with smaller phrase technology. Any transcriptional errors that result from this process are unintentional.

## 2015-02-08 NOTE — Patient Instructions (Signed)
Take antibiotics Use nasal saline spray or flonase  F/U as needed

## 2015-02-11 ENCOUNTER — Telehealth: Payer: Self-pay | Admitting: Family Medicine

## 2015-02-11 MED ORDER — GUAIFENESIN-CODEINE 100-10 MG/5ML PO SOLN: 5.0000 mL | Freq: Four times a day (QID) | ORAL | Status: DC | PRN

## 2015-02-11 MED FILL — CHERATUSSIN AC SYRUP: 100-10 | 9 days supply | Qty: 180 | Fill #0

## 2015-02-11 NOTE — Telephone Encounter (Signed)
Okay to send robitussin codiene 

## 2015-02-11 NOTE — Telephone Encounter (Signed)
[facility]  Patient would like to know if he could be prescribed cough med, he was in for sinus infection last week  [phone]

## 2015-02-11 NOTE — Telephone Encounter (Signed)
Medication called to pharmacy.  Call placed to patient and patient made aware.  

## 2015-02-11 NOTE — Telephone Encounter (Signed)
Call placed to patient.   Reports that cough has become nonproductive. Reports that cough is interrupting sleep.   Patient has tried cough drops, Mucinex and Delsym with no relief.   MD please advise.

## 2015-04-05 ENCOUNTER — Telehealth: Payer: Self-pay | Admitting: *Deleted

## 2015-04-05 MED ORDER — FLUTICASONE PROPIONATE 50 MCG/ACT NA SUSP: 2.0000 | Freq: Every day | NASAL | Status: DC

## 2015-04-05 MED FILL — FLUTICASONE PROP 50 MCG SPR: 50 | 30 days supply | Qty: 16 | Fill #0

## 2015-04-05 NOTE — Telephone Encounter (Signed)
[facility] to [facility], states pharmacy told him could get cheaper if was prescription. Please advise   outpt pharmacy.

## 2015-04-05 NOTE — Telephone Encounter (Signed)
Prescription sent to pharmacy.

## 2015-05-24 DIAGNOSIS — H5210 Myopia, unspecified eye: Secondary | ICD-10-CM | POA: Diagnosis not present

## 2015-06-12 DIAGNOSIS — M546 Pain in thoracic spine: Secondary | ICD-10-CM | POA: Diagnosis not present

## 2015-06-12 DIAGNOSIS — S134XXA Sprain of ligaments of cervical spine, initial encounter: Secondary | ICD-10-CM | POA: Diagnosis not present

## 2015-06-12 DIAGNOSIS — S338XXA Sprain of other parts of lumbar spine and pelvis, initial encounter: Secondary | ICD-10-CM | POA: Diagnosis not present

## 2015-10-17 MED FILL — MUPIROCIN 2% OINTMENT: 2 | 30 days supply | Qty: 22 | Fill #0

## 2015-10-17 MED FILL — CLINDAMYCIN HCL 300 MG CAPS: 300 | 10 days supply | Qty: 40 | Fill #0

## 2015-11-19 MED FILL — TRIAMCINOLONE 0.025% OINT: 0.025 | 10 days supply | Qty: 80 | Fill #0

## 2015-12-03 ENCOUNTER — Encounter (INDEPENDENT_AMBULATORY_CARE_PROVIDER_SITE_OTHER): Payer: Self-pay

## 2015-12-03 ENCOUNTER — Encounter (INDEPENDENT_AMBULATORY_CARE_PROVIDER_SITE_OTHER): Payer: Self-pay | Admitting: Internal Medicine

## 2016-01-16 ENCOUNTER — Ambulatory Visit (INDEPENDENT_AMBULATORY_CARE_PROVIDER_SITE_OTHER): Payer: 59 | Admitting: Internal Medicine

## 2016-04-17 ENCOUNTER — Encounter: Payer: Self-pay | Admitting: Family Medicine

## 2016-04-17 ENCOUNTER — Ambulatory Visit (INDEPENDENT_AMBULATORY_CARE_PROVIDER_SITE_OTHER): Payer: 59 | Admitting: Family Medicine

## 2016-04-17 VITALS — BP 118/60 | HR 82 | Temp 98.7°F | Resp 14 | Ht 70.0 in | Wt 174.0 lb

## 2016-04-17 DIAGNOSIS — L301 Dyshidrosis [pompholyx]: Secondary | ICD-10-CM

## 2016-04-17 MED ORDER — CLOBETASOL PROPIONATE 0.05 % EX CREA: 1.0000 "application " | TOPICAL_CREAM | Freq: Two times a day (BID) | CUTANEOUS | 1 refills | Status: DC

## 2016-04-17 MED FILL — CLOBETASOL 0.05% CREAM: 0.05 | 10 days supply | Qty: 30 | Fill #0

## 2016-04-17 NOTE — Progress Notes (Signed)
   Subjective:    Patient ID: Joshua Ho, male    DOB: 1980-05-19, 36 y.o.   MRN: [id number]  Patient presents for Rash (x6 months- R 4th finger irritation- states that rash starts off as small blister like areas and then burst leaving tenderness and irritation- has been using triamcinolone)  With recurrent rash of his ring finger on his right hand. He states that he was seen by the Tele Doc.with his work and was  prescribed triamcinolone a few months ago it helps clear it up some but then it comes back. Often have very tiny clear fluid blisters that pop up then a scab over and he just has dry rough skin on the finger. It has not spread to any other fingers. There is no pain in the joint is not having injury to the finger. He has not had any signs of infection such as redness or pus or pain.  Review Of Systems:  GEN- denies fatigue, fever, weight loss,weakness, recent illness MSK- denies joint pain, muscle aches, injury        Objective:    BP 118/60   Pulse 82   Temp 98.7 F (37.1 C) (Oral)   Resp 14   Ht  (1.778 m)   Wt 174 lb (78.9 kg)   SpO2 98%   BMI 24.97 kg/m  GEN- NAD, alert and oriented x3 Skin- Right hand- 4th digit dry ecezematous rash, scabs over previous blisters on lateral aspect of finger, no joint eefusion, normal fist and grasp, mild callus in bilat palms, otherwise no other rash on skin       Assessment & Plan:      Problem List Items Addressed This Visit    None    Visit Diagnoses    Dyshidrotic hand dermatitis    -  Primary   Eczema dermaitis, though possible herpes but no blisters seen, we reviewed picture of typical herpetic whitlow he did not think it looked the sme. Try Clobetasol He is to take pictures if blisters return back and see if they do look like herpetic whitlow so we'll treat him with Valtrex in addition, adjusting that it has not spread to any other fingers.       Note: This dictation was prepared with Dragon dictation along with  smaller phrase technology. Any transcriptional errors that result from this process are unintentional.

## 2016-04-17 NOTE — Patient Instructions (Signed)
Use cream as prescribed F/U as needed

## 2016-05-19 ENCOUNTER — Telehealth: Payer: Self-pay | Admitting: *Deleted

## 2016-05-19 NOTE — Telephone Encounter (Signed)
Received call from patient.   Repots that dermatitis on hands cleared while on creams, but now blister like areas are returning.   Requested MD to advise.

## 2016-05-19 NOTE — Telephone Encounter (Signed)
Patient states that he cannot come in on 05/20/2016.  States that blister is just starting. Appointment scheduled for Friday.

## 2016-05-19 NOTE — Telephone Encounter (Signed)
See if he can come in tomorrow to have this cultured before starting the creams

## 2016-05-22 ENCOUNTER — Encounter: Payer: Self-pay | Admitting: Family Medicine

## 2016-05-22 ENCOUNTER — Ambulatory Visit (INDEPENDENT_AMBULATORY_CARE_PROVIDER_SITE_OTHER): Payer: 59 | Admitting: Family Medicine

## 2016-05-22 ENCOUNTER — Other Ambulatory Visit: Payer: Self-pay | Admitting: Family Medicine

## 2016-05-22 VITALS — BP 118/62 | HR 70 | Temp 98.5°F | Resp 14 | Ht 70.0 in | Wt 173.0 lb

## 2016-05-22 DIAGNOSIS — L301 Dyshidrosis [pompholyx]: Secondary | ICD-10-CM

## 2016-05-22 DIAGNOSIS — R21 Rash and other nonspecific skin eruption: Secondary | ICD-10-CM | POA: Diagnosis not present

## 2016-05-22 MED ORDER — VALACYCLOVIR HCL 1 G PO TABS: 1000.0000 mg | ORAL_TABLET | Freq: Two times a day (BID) | ORAL | 0 refills | Status: DC

## 2016-05-22 MED FILL — valACYclovir HCL 1 GM TABS: 1 | 3 days supply | Qty: 6 | Fill #0

## 2016-05-22 NOTE — Patient Instructions (Signed)
F/U pending results   

## 2016-05-22 NOTE — Progress Notes (Signed)
   Subjective:    Patient ID: Joshua Ho, male    DOB: 02/22/1980, 36 y.o.   MRN: [id number]  Patient presents for Skin Irritation (blisters to finger) Here with recurrent blistering lesions to his right hand fourth digit only. He used the clobetasol everything cleared up completely he stopped it a few weeks ago and is now returned. It is itchy in nature they appear in clots around his finger. In the past he has been treated with triamcinolone as well as oral antibiotic he recalls now. He is not having rash on any other finger. He is not in contact with any harsh chemicals to his hands.    Review Of Systems:  GEN- denies fatigue, fever, weight loss,weakness, recent illness HEENT- denies eye drainage, change in vision, nasal discharge, CVS- denies chest pain, palpitations RESP- denies SOB, cough, wheeze ABD- denies N/V, change in stools, abd pain GU- denies dysuria, hematuria, dribbling, incontinence MSK- denies joint pain, muscle aches, injury Neuro- denies headache, dizziness, syncope, seizure activity       Objective:    BP 118/62   Pulse 70   Temp 98.5 F (36.9 C) (Oral)   Resp 14   Ht 5\' 10"  (1.778 m)   Wt 173 lb (78.5 kg)   SpO2 98%   BMI 24.82 kg/m  GEN- NAD, alert and oriented x3 Right hand- 4th digit, group vesicules along medial and lateral aspect of finger, mild erythema at base, some dry scabs, clear fluid expressed from some lesions         Assessment & Plan:      Problem List Items Addressed This Visit    None    Visit Diagnoses    Blistering rash    -  Primary   Initnally thoght to be more dyshidrotic eczema as he did respond to steroid but query viral etiology with recurrence and only 1 finger involved. HSV culture sent, trial of valtrex 1gram BID for 3 days to see if this makes a difference, await culture   Relevant Orders   Viral culture   Dyshidrotic hand dermatitis          Note: This dictation was prepared with Dragon dictation along with  smaller phrase technology. Any transcriptional errors that result from this process are unintentional.

## 2016-05-28 LAB — REFLEX ADENOVIRUS CULTURE

## 2016-05-28 LAB — RFX HSV/VARICELLA ZOSTER RAPID CULT

## 2016-05-29 ENCOUNTER — Other Ambulatory Visit: Payer: Self-pay | Admitting: *Deleted

## 2016-05-29 DIAGNOSIS — L301 Dyshidrosis [pompholyx]: Secondary | ICD-10-CM

## 2016-05-29 DIAGNOSIS — R21 Rash and other nonspecific skin eruption: Secondary | ICD-10-CM

## 2016-05-31 LAB — VIRAL CULTURE VIRC

## 2016-05-31 LAB — CYTOMEGALOVIRUS CULTURE

## 2016-08-13 ENCOUNTER — Encounter: Payer: Self-pay | Admitting: Family Medicine

## 2017-02-11 ENCOUNTER — Telehealth: Payer: Self-pay | Admitting: *Deleted

## 2017-02-11 DIAGNOSIS — R21 Rash and other nonspecific skin eruption: Secondary | ICD-10-CM

## 2017-02-11 DIAGNOSIS — L301 Dyshidrosis [pompholyx]: Secondary | ICD-10-CM

## 2017-02-11 NOTE — Telephone Encounter (Signed)
Received call from patient.   Reports that he has been seen in the past for blistering rash to fingers. States that he was given creams and referred to Dr. Margo AyeHall for dermatology.   States that he did not go to dermatologist. States that area was looking better while using cream, but has returned. Requested new referral, but does not want to go to Dr. Margo AyeHall.   Ok to re-order?

## 2017-02-12 NOTE — Telephone Encounter (Signed)
Referral orders placed

## 2017-02-12 NOTE — Telephone Encounter (Signed)
Okay to place new referral? 

## 2017-05-17 MED FILL — CLOBETASOL PROPIONATE 0.05: 0.05 | 25 days supply | Qty: 60 | Fill #0

## 2017-06-30 ENCOUNTER — Encounter: Payer: Self-pay | Admitting: Family Medicine

## 2017-06-30 ENCOUNTER — Ambulatory Visit (INDEPENDENT_AMBULATORY_CARE_PROVIDER_SITE_OTHER): Payer: No Typology Code available for payment source | Admitting: Family Medicine

## 2017-06-30 VITALS — BP 138/80 | HR 68 | Temp 98.6°F | Resp 14 | Ht 70.0 in | Wt 168.0 lb

## 2017-06-30 DIAGNOSIS — Z0000 Encounter for general adult medical examination without abnormal findings: Secondary | ICD-10-CM | POA: Diagnosis not present

## 2017-06-30 NOTE — Progress Notes (Signed)
Patient: Joshua Ho, Male    DOB: 1980/12/11, 37 y.o.   MRN: [id number] Visit Date: 07/01/2017  Today's Provider: Delsa Grana, PA-C   Chief Complaint  Patient presents with  . Annual Exam    pt NOT fasting   Subjective:    Annual physical exam Joshua Ho is a 37 y.o. male who presents today for health maintenance and complete physical. He feels well. He reports exercising every day at the gym, lifts and runs. He reports he is sleeping well.  ----------------------------------------------------------------- Patient presents for CPE today, he denies any new concerns or symptoms.  Has a history of eczema/atopic dermatitis, no current concerning rashes.  He uses over-the-counter seasonal allergy medicine as needed.  Has history of GERD, does see GI for this, but currently is not having symptoms and is not using PPIs. No other pertinent medical history.  He has not had well exam in almost 4 years.  Had mildly abnormal lipid panel in 2015. He continues to live a very healthy lifestyle working at the gym every day, reports a very clean and healthy diet.  He does work Architect as a Printmaker.  He is married has 2 young boys, ages 11 and 71.  Review of Systems  Constitutional: Negative.   HENT: Negative.   Eyes: Negative.   Respiratory: Negative.   Cardiovascular: Negative.   Gastrointestinal: Negative.   Endocrine: Negative.   Genitourinary: Negative.   Musculoskeletal: Negative.   Skin: Negative.   Allergic/Immunologic: Negative.   Neurological: Negative.   Hematological: Negative.   Psychiatric/Behavioral: Negative.     Social History      He  reports that he quit smoking about 7 years ago. His smoking use included cigarettes. He has a 4.50 pack-year smoking history. He has quit using smokeless tobacco. He reports that he does not drink alcohol or use drugs.       Social History   Socioeconomic History  . Marital status: Married    Spouse name: Not on file  .  Number of children: 2  . Years of education: Not on file  . Highest education level: Not on file  Occupational History  . Not on file  Social Needs  . Financial resource strain: Not hard at all  . Food insecurity:    Worry: Not on file    Inability: Not on file  . Transportation needs:    Medical: Not on file    Non-medical: Not on file  Tobacco Use  . Smoking status: Former Smoker    Packs/day: 0.75    Years: 6.00    Pack years: 4.50    Types: Cigarettes    Last attempt to quit: 07/11/2009    Years since quitting: 7.9  . Smokeless tobacco: Former Systems developer  . Tobacco comment: quit 2011  Substance and Sexual Activity  . Alcohol use: No    Alcohol/week: 0.0 oz  . Drug use: No  . Sexual activity: Yes  Lifestyle  . Physical activity:    Days per week: 7 days    Minutes per session: 90 min  . Stress: Not at all  Relationships  . Social connections:    Talks on phone: Not on file    Gets together: Not on file    Attends religious service: Not on file    Active member of club or organization: Not on file    Attends meetings of clubs or organizations: Not on file    Relationship status: Not on file  Other Topics Concern  . Not on file  Social History Narrative  . Not on file    Past Medical History:  Diagnosis Date  . Allergy   . Atopic dermatitis of eyelid   . GERD (gastroesophageal reflux disease)      Patient Active Problem List   Diagnosis Date Noted  . Gastritis 09/20/2013  . Dizziness and giddiness 09/20/2013  . Allergic dermatitis 07/10/2013  . GERD (gastroesophageal reflux disease) 04/26/2013  . Routine general medical examination at a health care facility 07/26/2011    Past Surgical History:  Procedure Laterality Date  . ESOPHAGOGASTRODUODENOSCOPY N/A 10/20/2013   Procedure: ESOPHAGOGASTRODUODENOSCOPY (EGD);  Surgeon: Rogene Houston, MD;  Location: [facility];  Service: Endoscopy;  Laterality: N/A;  855  . none    . WISDOM TOOTH EXTRACTION     x 4      Family History        Family Status  Relation Name Status  . Father  Alive  . Mother  Alive       good health  . Other married Alive  . Child 1 Alive       good health        His family history includes Hyperlipidemia in his father.      Allergies  Allergen Reactions  . Penicillins Rash     Current Outpatient Medications:  .  clobetasol cream (TEMOVATE) 3.00 %, Apply 1 application topically 2 (two) times daily., Disp: 30 g, Rfl: 1   Patient Care Team: [place], Modena Nunnery, MD as PCP - General (Family Medicine)      Objective:   Vitals: BP 138/80   Pulse 68   Temp 98.6 F (37 C) (Oral)   Resp 14   Ht 5' 10"  (1.778 m)   Wt 168 lb (76.2 kg)   SpO2 98%   BMI 24.11 kg/m    Vitals:   06/30/17 1509  BP: 138/80  Pulse: 68  Resp: 14  Temp: 98.6 F (37 C)  TempSrc: Oral  SpO2: 98%  Weight: 168 lb (76.2 kg)  Height: 5' 10"  (1.778 m)     Physical Exam  Constitutional: He is oriented to person, place, and time. He appears well-developed and well-nourished.  Non-toxic appearance. He does not appear ill. No distress.  HENT:  Head: Normocephalic and atraumatic.  Right Ear: Tympanic membrane, external ear and ear canal normal.  Left Ear: Tympanic membrane, external ear and ear canal normal.  Nose: Nose normal. No mucosal edema or rhinorrhea. Right sinus exhibits no maxillary sinus tenderness and no frontal sinus tenderness. Left sinus exhibits no maxillary sinus tenderness and no frontal sinus tenderness.  Mouth/Throat: Uvula is midline and oropharynx is clear and moist. No trismus in the jaw. No uvula swelling. No oropharyngeal exudate, posterior oropharyngeal edema or posterior oropharyngeal erythema.  Eyes: Pupils are equal, round, and reactive to light. Conjunctivae, EOM and lids are normal. No scleral icterus.  Neck: Trachea normal, normal range of motion and phonation normal. Neck supple. No tracheal deviation present. No thyromegaly present.  Cardiovascular:  Normal rate, regular rhythm, normal heart sounds, intact distal pulses and normal pulses. Exam reveals no gallop and no friction rub.  No murmur heard. Pulses:      Radial pulses are 2+ on the right side, and 2+ on the left side.       Posterior tibial pulses are 2+ on the right side, and 2+ on the left side.  Pulmonary/Chest: Effort normal and breath  sounds normal. No stridor. No respiratory distress. He has no wheezes. He has no rhonchi. He has no rales. He exhibits no tenderness.  Abdominal: Soft. Normal appearance and bowel sounds are normal. He exhibits no distension. There is no tenderness. There is no rebound and no guarding.  Musculoskeletal: Normal range of motion. He exhibits no edema.  Lymphadenopathy:    He has no cervical adenopathy.  Neurological: He is alert and oriented to person, place, and time. No sensory deficit. He exhibits normal muscle tone. Coordination and gait normal.  Skin: Skin is warm, dry and intact. Capillary refill takes less than 2 seconds. No rash noted. He is not diaphoretic.  Psychiatric: He has a normal mood and affect. His speech is normal and behavior is normal. Judgment and thought content normal.  Nursing note and vitals reviewed.    Depression Screen PHQ 2/9 Scores 06/30/2017 04/17/2016 04/26/2013  PHQ - 2 Score 0 0 0  PHQ- 9 Score - 0 -      Assessment & Plan:     Routine Health Maintenance and Physical Exam  Exercise Activities and Dietary recommendations Goals    None    Continue healthy lifestyle  Immunization History  Administered Date(s) Administered  . Hepatitis B 12/01/1993, 12/27/1993, 05/04/1994  . Influenza,inj,Quad PF,6+ Mos 09/20/2013, 10/11/2014  . MMR 05/02/1994  . Td 05/04/1994  . Tdap 06/16/2012    Health Maintenance  Topic Date Due  . HIV Screening  03/08/1997  . INFLUENZA VACCINE  08/05/2017  . TETANUS/TDAP  06/17/2022   Basic labs and HIV screening ordered and will be obtained when patient returns  fasting.  Discussed health benefits of physical activity, and encouraged him to engage in regular exercise appropriate for his age and condition.    --------------------------------------------------------------------   ICD-10-CM   1. General medical exam Z00.00 CBC with Differential/Platelet    COMPLETE METABOLIC PANEL WITH GFR    Lipid panel    HIV antibody     Delsa Grana, PA-C 07/01/17 9:29 PM  [facility]

## 2017-06-30 NOTE — Patient Instructions (Signed)

## 2017-07-01 ENCOUNTER — Other Ambulatory Visit: Payer: No Typology Code available for payment source

## 2017-07-01 ENCOUNTER — Encounter: Payer: Self-pay | Admitting: Family Medicine

## 2017-07-01 DIAGNOSIS — Z0000 Encounter for general adult medical examination without abnormal findings: Secondary | ICD-10-CM

## 2017-07-02 LAB — CBC WITH DIFFERENTIAL/PLATELET
BASOS ABS: 48 {cells}/uL (ref 0–200)
BASOS PCT: 0.8 %
EOS PCT: 3.8 %
Eosinophils Absolute: 228 cells/uL (ref 15–500)
HEMATOCRIT: 45.9 % (ref 38.5–50.0)
HEMOGLOBIN: 15.3 g/dL (ref 13.2–17.1)
LYMPHS ABS: 2076 {cells}/uL (ref 850–3900)
MCH: 28.1 pg (ref 27.0–33.0)
MCHC: 33.3 g/dL (ref 32.0–36.0)
MCV: 84.2 fL (ref 80.0–100.0)
MPV: 10.2 fL (ref 7.5–12.5)
Monocytes Relative: 9.3 %
NEUTROS ABS: 3090 {cells}/uL (ref 1500–7800)
Neutrophils Relative %: 51.5 %
Platelets: 248 10*3/uL (ref 140–400)
RBC: 5.45 10*6/uL (ref 4.20–5.80)
RDW: 12.2 % (ref 11.0–15.0)
Total Lymphocyte: 34.6 %
WBC mixed population: 558 cells/uL (ref 200–950)
WBC: 6 10*3/uL (ref 3.8–10.8)

## 2017-07-02 LAB — LIPID PANEL
CHOL/HDL RATIO: 4.5 (calc) (ref <5.0)
Cholesterol: 181 mg/dL (ref <200)
HDL: 40 mg/dL — ABNORMAL LOW (ref >40)
LDL Cholesterol (Calc): 122 mg/dL (calc) — ABNORMAL HIGH
Non-HDL Cholesterol (Calc): 141 mg/dL (calc) — ABNORMAL HIGH (ref <130)
TRIGLYCERIDES: 86 mg/dL (ref <150)

## 2017-07-02 LAB — COMPREHENSIVE METABOLIC PANEL WITH GFR
AG Ratio: 1.4 (calc) (ref 1.0–2.5)
ALT: 31 U/L (ref 9–46)
AST: 24 U/L (ref 10–40)
Albumin: 4 g/dL (ref 3.6–5.1)
Alkaline phosphatase (APISO): 65 U/L (ref 40–115)
BUN: 18 mg/dL (ref 7–25)
CO2: 28 mmol/L (ref 20–32)
Calcium: 9.1 mg/dL (ref 8.6–10.3)
Chloride: 104 mmol/L (ref 98–110)
Creat: 1.21 mg/dL (ref 0.60–1.35)
Globulin: 2.9 g/dL (ref 1.9–3.7)
Glucose, Bld: 96 mg/dL (ref 65–99)
Potassium: 4.5 mmol/L (ref 3.5–5.3)
Sodium: 138 mmol/L (ref 135–146)
Total Bilirubin: 0.7 mg/dL (ref 0.2–1.2)
Total Protein: 6.9 g/dL (ref 6.1–8.1)

## 2017-07-02 LAB — HIV ANTIBODY (ROUTINE TESTING W REFLEX): HIV 1&2 Ab, 4th Generation: NONREACTIVE

## 2017-07-02 NOTE — Progress Notes (Signed)
Please notify pt of lab work -   Everything looks great except for a little dyslipidemia.  HDL's are a little low and they protect heart, and LDL's are high, can increase risk of heart disease/stroke.  These are improved with diet and exercise.  He is already living a very healthy lifestyle.  He can add some more aerobic exercise time to his routine.  Eat fish and lean meats, avoid high fat food/fried foods, increase high fiber food and vegetables in diet.    If he has Q's he can look up american heart association cholesterol guide - or we can mail him something.    Also his BP was mildly elevated from goal - 138/80, goal <130/80.  Will recheck this at next visit.  (6 m to 1 yr)

## 2017-08-03 ENCOUNTER — Encounter: Payer: Self-pay | Admitting: Family Medicine

## 2017-08-03 ENCOUNTER — Ambulatory Visit (INDEPENDENT_AMBULATORY_CARE_PROVIDER_SITE_OTHER): Payer: No Typology Code available for payment source | Admitting: Family Medicine

## 2017-08-03 VITALS — BP 104/62 | HR 60 | Temp 98.5°F | Resp 14 | Ht 70.0 in | Wt 165.4 lb

## 2017-08-03 DIAGNOSIS — S90414A Abrasion, right lesser toe(s), initial encounter: Secondary | ICD-10-CM | POA: Diagnosis not present

## 2017-08-03 MED ORDER — TRAMADOL HCL 50 MG PO TABS: 50.0000 mg | ORAL_TABLET | Freq: Three times a day (TID) | ORAL | 0 refills | Status: DC | PRN

## 2017-08-03 MED ORDER — CEPHALEXIN 500 MG PO CAPS: 500.0000 mg | ORAL_CAPSULE | Freq: Four times a day (QID) | ORAL | 0 refills | Status: AC

## 2017-08-03 NOTE — Progress Notes (Signed)
Patient ID: Joshua Ho, male    DOB: 10/05/1980, 37 y.o.   MRN: [name]  PCP: Salley Scarleturham, Kawanta F, MD  Chief Complaint  Patient presents with  . cut 3 toes on right foot with pressure washer    Subjective:   Joshua DagoJason Guy Ho is a 37 y.o. male, presents to clinic with CC of skin wound to right 1st - 3rd toes. Three days ago he accidentally damaged skin with pressure washer.  It removed skin from his toes in a linear fashion.  It bleed briefly and did look like there was a "gash."  He has been limping a little bit, keeping it covered with bandages and pain was mild until yesterday when it rapidly worsened in the afternoon and evening.  Pain was severe, burning and stinging, he could not sleep, pain was too severe to remove or adjust bandages.  He notes some purulent drainage.  He could not put his foot into any shoes because of pain.  He had been pouring hydrogen peroxide on it each morning, allowing some "air time" to dry, and also used over the counter antibiotic ointment was bandages.  Pain is worse with any movement of his toes.  Swelling has not changed much in the past 1-2 days.  The wound/skin area is not cracking or gaping open.         Patient Active Problem List   Diagnosis Date Noted  . Gastritis 09/20/2013  . Dizziness and giddiness 09/20/2013  . Allergic dermatitis 07/10/2013  . GERD (gastroesophageal reflux disease) 04/26/2013  . Routine general medical examination at a health care facility 07/26/2011     Prior to Admission medications   Medication Sig Start Date End Date Taking? Authorizing Provider  clobetasol cream (TEMOVATE) 0.05 % Apply 1 application topically 2 (two) times daily. 04/17/16  Yes [place], Velna HatchetKawanta F, MD     Allergies  Allergen Reactions  . Penicillins Rash     Family History  Problem Relation Age of Onset  . Hyperlipidemia Father      Social History   Socioeconomic History  . Marital status: Married    Spouse name: Not on file  .  Number of children: 2  . Years of education: Not on file  . Highest education level: Not on file  Occupational History  . Not on file  Social Needs  . Financial resource strain: Not hard at all  . Food insecurity:    Worry: Not on file    Inability: Not on file  . Transportation needs:    Medical: Not on file    Non-medical: Not on file  Tobacco Use  . Smoking status: Former Smoker    Packs/day: 0.75    Years: 6.00    Pack years: 4.50    Types: Cigarettes    Last attempt to quit: 07/11/2009    Years since quitting: 8.0  . Smokeless tobacco: Former NeurosurgeonUser  . Tobacco comment: quit 2011  Substance and Sexual Activity  . Alcohol use: No    Alcohol/week: 0.0 oz  . Drug use: No  . Sexual activity: Yes  Lifestyle  . Physical activity:    Days per week: 7 days    Minutes per session: 90 min  . Stress: Not at all  Relationships  . Social connections:    Talks on phone: Not on file    Gets together: Not on file    Attends religious service: Not on file    Active member of  club or organization: Not on file    Attends meetings of clubs or organizations: Not on file    Relationship status: Not on file  . Intimate partner violence:    Fear of current or ex partner: Not on file    Emotionally abused: Not on file    Physically abused: Not on file    Forced sexual activity: Not on file  Other Topics Concern  . Not on file  Social History Narrative  . Not on file     Review of Systems  Constitutional: Negative.   HENT: Negative.   Eyes: Negative.   Respiratory: Negative.   Cardiovascular: Negative.   Gastrointestinal: Negative.   Endocrine: Negative.   Genitourinary: Negative.   Musculoskeletal: Negative.   Skin: Negative.   Allergic/Immunologic: Negative.   Neurological: Negative.   Hematological: Negative.   Psychiatric/Behavioral: Negative.   All other systems reviewed and are negative.      Objective:    Vitals:   08/03/17 1133  BP: 104/62  Pulse: 60    Resp: 14  Temp: 98.5 F (36.9 C)  TempSrc: Oral  SpO2: 98%  Weight: 165 lb 6.4 oz (75 kg)  Height: 5\' 10"  (1.778 m)      Physical Exam  Constitutional: He appears well-developed and well-nourished. No distress.  HENT:  Head: Normocephalic and atraumatic.  Nose: Nose normal.  Eyes: Conjunctivae are normal. Right eye exhibits no discharge. Left eye exhibits no discharge.  Neck: No tracheal deviation present.  Cardiovascular: Normal rate and regular rhythm.  Pulmonary/Chest: Effort normal. No stridor. No respiratory distress.  Musculoskeletal: Normal range of motion.  Neurological: He is alert. He exhibits normal muscle tone. Coordination normal.  Skin: Skin is warm. No rash noted. He is not diaphoretic.  3mm wide linear skin abrasion with scabbing across length of dorsal aspect of right 3rd mid dorsal toe and right 2nd mid toe and small area of right great toe with small wound/abrasion. Very mild surrounding edema, no erythema, no purulent discharge.  Decreased ROM of toes secondary to pain.  With passive movement no gaping of skin   Psychiatric: He has a normal mood and affect. His behavior is normal.  Nursing note and vitals reviewed.           Assessment & Plan:      ICD-10-CM   1. Abrasion of toe of right foot, initial encounter S90.414A     Abrasion/wound to right toes (1-3) from pressure washer, injury is more consistent with burn than abrasion.  Pt notes draining of pus and sudden increase of pain last night.   On exam there is mild edema, but no overly concerning signs of cellulitis, no induration, no erythema.  Will cover with keflex.  Pt states he had rash allergy to penicillin, no anaphylaxis or airway involvement.  Pain meds for severe pain, pt to use tylenol and aleve first line. Elevate foot Apply antibiotic ointment 2x a day with loose gauze dressing.  D/C hydrogen peroxide F/Up as needed with any worsening pain, swelling, any new redness.     Danelle Berry, PA-C 08/03/17 11:50 AM

## 2017-08-04 ENCOUNTER — Telehealth: Payer: Self-pay | Admitting: Family Medicine

## 2017-08-04 ENCOUNTER — Other Ambulatory Visit: Payer: Self-pay | Admitting: Family Medicine

## 2017-08-04 MED ORDER — HYDROCODONE-ACETAMINOPHEN 10-325 MG PO TABS: 1.0000 | ORAL_TABLET | Freq: Three times a day (TID) | ORAL | 0 refills | Status: AC | PRN

## 2017-08-04 MED FILL — HYDROCODON-APAP 10-325: 10-325 | 10 days supply | Qty: 30 | Fill #0

## 2017-08-04 NOTE — Telephone Encounter (Signed)
Pt aware and will take Tramadol back. Also he thinks the antibx is making him sick. Told pt to try and eat something before taking the antibx to see if that will help. Pt verbalizes understanding and will try to eat something to see if nausea goes away, if not informed to call back.

## 2017-08-04 NOTE — Telephone Encounter (Signed)
Send to provider who saw patient

## 2017-08-04 NOTE — Telephone Encounter (Signed)
Please have him turn in tramadol at pharmacy  Norco sent to pharmacy

## 2017-08-04 NOTE — Telephone Encounter (Signed)
Patient called in stating that he took the tramadol that was prescribed yesterday. States that it helped with the pain some but caused him nausea. Patient would like to know if you could call something else in. Please advise?

## 2018-10-24 ENCOUNTER — Other Ambulatory Visit: Payer: Self-pay

## 2018-10-24 DIAGNOSIS — Z20822 Contact with and (suspected) exposure to covid-19: Secondary | ICD-10-CM

## 2018-10-26 LAB — NOVEL CORONAVIRUS, NAA: SARS-CoV-2, NAA: NOT DETECTED

## 2019-01-17 ENCOUNTER — Ambulatory Visit (INDEPENDENT_AMBULATORY_CARE_PROVIDER_SITE_OTHER): Payer: No Typology Code available for payment source | Admitting: Family Medicine

## 2019-01-17 ENCOUNTER — Other Ambulatory Visit: Payer: Self-pay

## 2019-01-17 ENCOUNTER — Encounter: Payer: Self-pay | Admitting: Family Medicine

## 2019-01-17 VITALS — BP 110/64 | HR 62 | Temp 98.0°F | Resp 14 | Ht 70.0 in | Wt 167.0 lb

## 2019-01-17 DIAGNOSIS — M542 Cervicalgia: Secondary | ICD-10-CM | POA: Diagnosis not present

## 2019-01-17 DIAGNOSIS — M436 Torticollis: Secondary | ICD-10-CM

## 2019-01-17 MED ORDER — CYCLOBENZAPRINE HCL 10 MG PO TABS: 10.0000 mg | ORAL_TABLET | Freq: Three times a day (TID) | ORAL | 0 refills | Status: DC | PRN

## 2019-01-17 MED ORDER — NAPROXEN 500 MG PO TABS: 500.0000 mg | ORAL_TABLET | Freq: Two times a day (BID) | ORAL | 0 refills | Status: DC

## 2019-01-17 MED ORDER — HYDROCODONE-ACETAMINOPHEN 5-325 MG PO TABS: 1.0000 | ORAL_TABLET | Freq: Four times a day (QID) | ORAL | 0 refills | Status: DC | PRN

## 2019-01-17 NOTE — Progress Notes (Signed)
   Subjective:    Patient ID: Joshua Ho, male    DOB: [birth year], 39 y.o.   MRN: [id number]  Patient presents for R Sided Pain (neck stiffness, shoulder pain, pain moving into neck/ spine)  He woke up with crick on right side of neck on Friday. 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, pain has improved, then yesterday noticed severe stiffness in neck and into shoulder blade and down mid spine  He has using heat/ice, aleve minimal improvement  No tingling or numbness in hand  Went to [facility], he had adjustment yesterday  no improvement      Review Of Systems:  GEN- denies fatigue, fever, weight loss,weakness, recent illness HEENT- denies eye drainage, change in vision, nasal discharge, CVS- denies chest pain, palpitations RESP- denies SOB, cough, wheeze ABD- denies N/V, change in stools, abd pain GU- denies dysuria, hematuria, dribbling, incontinence MSK- + joint pain, muscle aches, injury Neuro- denies headache, dizziness, syncope, seizure activity       Objective:    BP 110/64   Pulse 62   Temp 98 F (36.7 C) (Temporal)   Resp 14   Ht 5\' 10"  (1.778 m)   Wt 167 lb (75.8 kg)   SpO2 99%   BMI 23.96 kg/m  GEN- NAD, alert and oriented x3, holding neck stiffly  HEENT- PERRL, EOMI, non injected sclera, pink conjunctiva, MMM, oropharynx clear Neck- Supple, + spasm decreased ROM, TTP over trapezius, C spine CVS- RRR, no murmur RESP-CTAB MSK- mild TTP between shoulder blades bilat, spasm noted upper back/neck, FROM upper ext, rotator cuff in tact Neuro-CNII-XII in tact, normal tone UE, sensation grossly in tact, strength in tact UE Pulses- Radial, DP- 2+        Assessment & Plan:      Problem List Items Addressed This Visit    None    Visit Diagnoses    Acute muscle stiffness of neck    -  Primary   MSK stiffness/pain, no sign of nerve impingement, will given naprosyn for inflammation, flexeril, norco #15  tabs, continue hEAT/ICE, stretch, avoid weight lifting for another 1-2 weeks No imaging needed today       Note: This dictation was prepared with Dragon dictation along with smaller phrase technology. Any transcriptional errors that result from this process are unintentional.

## 2019-01-17 NOTE — Patient Instructions (Signed)
F/U as needed Schedule a physical

## 2019-10-18 ENCOUNTER — Encounter: Payer: No Typology Code available for payment source | Admitting: Family Medicine

## 2019-11-27 ENCOUNTER — Encounter: Payer: No Typology Code available for payment source | Admitting: Family Medicine

## 2020-01-29 ENCOUNTER — Encounter: Payer: No Typology Code available for payment source | Admitting: Family Medicine

## 2020-02-28 ENCOUNTER — Encounter: Payer: Self-pay | Admitting: Family Medicine

## 2020-02-28 ENCOUNTER — Other Ambulatory Visit: Payer: Self-pay

## 2020-02-28 ENCOUNTER — Other Ambulatory Visit (HOSPITAL_COMMUNITY): Payer: Self-pay | Admitting: Family Medicine

## 2020-02-28 ENCOUNTER — Ambulatory Visit (INDEPENDENT_AMBULATORY_CARE_PROVIDER_SITE_OTHER): Payer: No Typology Code available for payment source | Admitting: Family Medicine

## 2020-02-28 VITALS — BP 120/80 | HR 63 | Temp 97.2°F | Ht 70.0 in | Wt 170.0 lb

## 2020-02-28 DIAGNOSIS — L239 Allergic contact dermatitis, unspecified cause: Secondary | ICD-10-CM

## 2020-02-28 DIAGNOSIS — Z1159 Encounter for screening for other viral diseases: Secondary | ICD-10-CM

## 2020-02-28 DIAGNOSIS — Z0001 Encounter for general adult medical examination with abnormal findings: Secondary | ICD-10-CM | POA: Diagnosis not present

## 2020-02-28 DIAGNOSIS — Z0000 Encounter for general adult medical examination without abnormal findings: Secondary | ICD-10-CM

## 2020-02-28 LAB — CBC WITH DIFFERENTIAL/PLATELET
Hemoglobin: 15.7 g/dL (ref 13.2–17.1)
Lymphs Abs: 2664 cells/uL (ref 850–3900)
MCHC: 34 g/dL (ref 32.0–36.0)
Monocytes Relative: 7.1 %
WBC: 8 10*3/uL (ref 3.8–10.8)

## 2020-02-28 MED ORDER — CLOBETASOL PROPIONATE 0.05 % EX CREA: 1.0000 "application " | TOPICAL_CREAM | Freq: Two times a day (BID) | CUTANEOUS | 1 refills | Status: AC

## 2020-02-28 MED FILL — CLOBETASOL 0.05% CREAM: 0.05 | 15 days supply | Qty: 30 | Fill #0

## 2020-02-28 NOTE — Assessment & Plan Note (Signed)
CPE done.  Fasting labs.  Hepatitis C screening.  Blood pressure weight at goal.

## 2020-02-28 NOTE — Progress Notes (Signed)
   Subjective:    Patient ID: Joshua Ho, male    DOB: [birth year], 40 y.o.   MRN: [id number]  Patient presents for Annual Exam  Pt here for CPE  Medications reviewed he needs a refill on his clobetasol cream he uses for his chronic dermatitis intermittently.  Reflux has improved with dietary changes   Follows with eye doctor  Follows with dentist   He does not have any concerns today.  He does exercise at least 5 days a week on his lunch break.  Immunizations declines flu shot and Covid vaccine.  Tetanus up-to-date  Review Of Systems:  GEN- denies fatigue, fever, weight loss,weakness, recent illness HEENT- denies eye drainage, change in vision, nasal discharge, CVS- denies chest pain, palpitations RESP- denies SOB, cough, wheeze ABD- denies N/V, change in stools, abd pain GU- denies dysuria, hematuria, dribbling, incontinence MSK- denies joint pain, muscle aches, injury Neuro- denies headache, dizziness, syncope, seizure activity       Objective:    BP 120/80   Pulse 63   Temp (!) 97.2 F (36.2 C)   Ht 5\' 10"  (1.778 m)   Wt 170 lb (77.1 kg)   SpO2 99%   BMI 24.39 kg/m  GEN- NAD, alert and oriented x3 HEENT- PERRL, EOMI, non injected sclera, pink conjunctiva, MMM, oropharynx clear, TM clear bilaterally no effusion Neck- Supple, no thyromegaly CVS- RRR, no murmur RESP-CTAB ABD-NABS,soft,NT,ND Psych normal affect and mood EXT- No edema Pulses- Radial, DP- 2+  Fall/depression/AUDIT-C screening negative      Assessment & Plan:      Problem List Items Addressed This Visit      Unprioritized   Allergic dermatitis    Prn clobetasol      Routine general medical examination at a health care facility - Primary    CPE done.  Fasting labs.  Hepatitis C screening.  Blood pressure weight at goal.      Relevant Orders   CBC with Differential/Platelet   Comprehensive metabolic panel   Lipid panel    Other Visit Diagnoses    Need for hepatitis C screening  test       Relevant Orders   Hepatitis C antibody      Note: This dictation was prepared with Dragon dictation along with smaller phrase technology. Any transcriptional errors that result from this process are unintentional.

## 2020-02-28 NOTE — Assessment & Plan Note (Signed)
Prn clobetasol

## 2020-02-28 NOTE — Patient Instructions (Signed)
Change to Jessica  

## 2020-02-29 LAB — COMPREHENSIVE METABOLIC PANEL
AG Ratio: 1.6 (calc) (ref 1.0–2.5)
ALT: 44 U/L (ref 9–46)
AST: 34 U/L (ref 10–40)
Albumin: 4.4 g/dL (ref 3.6–5.1)
Alkaline phosphatase (APISO): 56 U/L (ref 36–130)
BUN: 19 mg/dL (ref 7–25)
CO2: 25 mmol/L (ref 20–32)
Calcium: 9.2 mg/dL (ref 8.6–10.3)
Chloride: 102 mmol/L (ref 98–110)
Creat: 1.13 mg/dL (ref 0.60–1.35)
Globulin: 2.8 g/dL (calc) (ref 1.9–3.7)
Glucose, Bld: 82 mg/dL (ref 65–99)
Potassium: 3.8 mmol/L (ref 3.5–5.3)
Sodium: 138 mmol/L (ref 135–146)
Total Bilirubin: 0.6 mg/dL (ref 0.2–1.2)
Total Protein: 7.2 g/dL (ref 6.1–8.1)

## 2020-02-29 LAB — CBC WITH DIFFERENTIAL/PLATELET
Absolute Monocytes: 568 cells/uL (ref 200–950)
Basophils Absolute: 32 cells/uL (ref 0–200)
Basophils Relative: 0.4 %
Eosinophils Absolute: 200 cells/uL (ref 15–500)
Eosinophils Relative: 2.5 %
HCT: 46.2 % (ref 38.5–50.0)
MCH: 29.1 pg (ref 27.0–33.0)
MCV: 85.7 fL (ref 80.0–100.0)
MPV: 10.8 fL (ref 7.5–12.5)
Neutro Abs: 4536 cells/uL (ref 1500–7800)
Neutrophils Relative %: 56.7 %
Platelets: 252 10*3/uL (ref 140–400)
RBC: 5.39 10*6/uL (ref 4.20–5.80)
RDW: 11.9 % (ref 11.0–15.0)
Total Lymphocyte: 33.3 %

## 2020-02-29 LAB — LIPID PANEL
Cholesterol: 227 mg/dL — ABNORMAL HIGH (ref <200)
HDL: 47 mg/dL (ref >=40)
LDL Cholesterol (Calc): 160 mg/dL (calc) — ABNORMAL HIGH
Non-HDL Cholesterol (Calc): 180 mg/dL (calc) — ABNORMAL HIGH (ref <130)
Total CHOL/HDL Ratio: 4.8 (calc) (ref <5.0)
Triglycerides: 92 mg/dL (ref <150)

## 2020-02-29 LAB — HEPATITIS C ANTIBODY
Hepatitis C Ab: NONREACTIVE
SIGNAL TO CUT-OFF: 0.01 (ref <1.00)

## 2020-03-11 ENCOUNTER — Encounter: Payer: Self-pay | Admitting: Family Medicine

## 2020-03-11 ENCOUNTER — Other Ambulatory Visit (HOSPITAL_COMMUNITY): Payer: Self-pay | Admitting: Family Medicine

## 2020-03-11 ENCOUNTER — Telehealth: Payer: Self-pay | Admitting: Nurse Practitioner

## 2020-03-11 MED ORDER — CYCLOBENZAPRINE HCL 10 MG PO TABS: 10.0000 mg | ORAL_TABLET | Freq: Three times a day (TID) | ORAL | 0 refills | Status: DC | PRN

## 2020-03-11 MED FILL — CYCLOBENZAPRINE HCL 10 MG T: 10 | 10 days supply | Qty: 30 | Fill #0

## 2020-03-11 NOTE — Telephone Encounter (Signed)
Flexeril sent to pharmacy.

## 2020-03-11 NOTE — Telephone Encounter (Signed)
Doctor has already [employer]

## 2020-03-11 NOTE — Telephone Encounter (Signed)
Patient called in states he was seen a few months ago for a pulled muscle and he has pulled that same muscle. He would like to know if a muscle relaxer can be called in for him. He states he is working in [place] and isn't sure if he can get here to be seen.    CB# [phone]

## 2020-05-03 ENCOUNTER — Other Ambulatory Visit (HOSPITAL_COMMUNITY): Payer: Self-pay

## 2020-05-03 ENCOUNTER — Other Ambulatory Visit: Payer: Self-pay

## 2020-05-03 ENCOUNTER — Ambulatory Visit
Admission: RE | Admit: 2020-05-03 | Discharge: 2020-05-03 | Disposition: A | Discharge status: Home / Self Care | Payer: No Typology Code available for payment source | Source: Ambulatory Visit | Attending: Emergency Medicine | Admitting: Emergency Medicine

## 2020-05-03 VITALS — BP 113/74 | HR 86 | Temp 99.5°F | Resp 17

## 2020-05-03 DIAGNOSIS — J069 Acute upper respiratory infection, unspecified: Secondary | ICD-10-CM | POA: Diagnosis not present

## 2020-05-03 DIAGNOSIS — R6889 Other general symptoms and signs: Secondary | ICD-10-CM

## 2020-05-03 MED ORDER — HYDROCODONE BIT-HOMATROP MBR 5-1.5 MG/5ML PO SOLN: 5.0000 mL | Freq: Two times a day (BID) | ORAL | 0 refills | Status: DC | PRN

## 2020-05-03 MED ORDER — MELOXICAM 15 MG PO TABS
15.0000 mg | ORAL_TABLET | Freq: Every day | ORAL | 0 refills | Status: DC
  Filled 2020-05-03: qty 30, 30d supply, fill #0

## 2020-05-03 MED ORDER — MELOXICAM 15 MG PO TABS: 15.0000 mg | ORAL_TABLET | Freq: Every day | ORAL | 0 refills | Status: DC

## 2020-05-03 MED ORDER — BENZONATATE 100 MG PO CAPS
100.0000 mg | ORAL_CAPSULE | Freq: Three times a day (TID) | ORAL | 0 refills | Status: DC
  Filled 2020-05-03: qty 21, 7d supply, fill #0

## 2020-05-03 MED ORDER — BENZONATATE 100 MG PO CAPS: 100.0000 mg | ORAL_CAPSULE | Freq: Three times a day (TID) | ORAL | 0 refills | Status: DC

## 2020-05-03 NOTE — ED Provider Notes (Addendum)
[facility]   [id number] 05/03/20 Arrival Time: 7829   CC: Flu symptoms  SUBJECTIVE: History from: patient.  Joshua Ho is a 39 y.o. male who presents with pressure behind RT ear, cough, chills, and fever x 8 days.  Daughter tested positive for the flu.  Has tried OTC medications without relief.  Denies aggravating factors.  Reports previous symptoms in the past.   Denies sinus pain, rhinorrhea, SOB, wheezing, chest pain, nausea, changes in bowel or bladder habits.    ROS: As per HPI.  All other pertinent ROS negative.     Past Medical History:  Diagnosis Date  . Allergy   . Atopic dermatitis of eyelid   . GERD (gastroesophageal reflux disease)    Past Surgical History:  Procedure Laterality Date  . ESOPHAGOGASTRODUODENOSCOPY N/A 10/20/2013   Procedure: ESOPHAGOGASTRODUODENOSCOPY (EGD);  Surgeon: Malissa Hippo, MD;  Location: [facility];  Service: Endoscopy;  Laterality: N/A;  855  . none    . WISDOM TOOTH EXTRACTION     x 4   Allergies  Allergen Reactions  . Penicillins Rash   No current facility-administered medications on file prior to encounter.   Current Outpatient Medications on File Prior to Encounter  Medication Sig Dispense Refill  . clobetasol cream (TEMOVATE) 0.05 % Apply 1 application topically 2 (two) times daily. 30 g 1  . clobetasol cream (TEMOVATE) 0.05 % APPLY 1 APPLICATION TOPICALLY 2 (TWO) TIMES DAILY. 30 g 1   Social History   Socioeconomic History  . Marital status: Married    Spouse name: Not on file  . Number of children: 2  . Years of education: Not on file  . Highest education level: Not on file  Occupational History  . Not on file  Tobacco Use  . Smoking status: Former Smoker    Packs/day: 0.75    Years: 6.00    Pack years: 4.50    Types: Cigarettes    Quit date: 07/11/2009    Years since quitting: 10.8  . Smokeless tobacco: Former Neurosurgeon  . Tobacco comment: quit 2011  Vaping Use  . Vaping Use: Never used   Substance and Sexual Activity  . Alcohol use: No    Alcohol/week: 0.0 standard drinks  . Drug use: No  . Sexual activity: Yes  Other Topics Concern  . Not on file  Social History Narrative  . Not on file   Social Determinants of Health   Financial Resource Strain: Not on file  Food Insecurity: Not on file  Transportation Needs: Not on file  Physical Activity: Not on file  Stress: Not on file  Social Connections: Not on file  Intimate Partner Violence: Not on file   Family History  Problem Relation Age of Onset  . Hyperlipidemia Father     OBJECTIVE:  Vitals:   05/03/20 0854  BP: 113/74  Pulse: 86  Resp: 17  Temp: 99.5 F (37.5 C)  TempSrc: Oral  SpO2: 98%     General appearance: alert; appears fatigued, but nontoxic; speaking in full sentences and tolerating own secretions HEENT: NCAT; Ears: EACs clear, TMs pearly gray; Eyes: PERRL.  EOM grossly intact. Nose: nares patent without rhinorrhea, Throat: oropharynx clear, tonsils non erythematous or enlarged, uvula midline  Neck: supple without LAD Lungs: unlabored respirations, symmetrical air entry; cough: mild; no respiratory distress; CTAB Heart: regular rate and rhythm.  Skin: warm and dry Psychological: alert and cooperative; normal mood and affect  ASSESSMENT & PLAN:  1. Viral URI  with cough   2. Flu-like symptoms     Meds ordered this encounter  Medications  . benzonatate (TESSALON) 100 MG capsule    Sig: Take 1 capsule (100 mg total) by mouth every 8 (eight) hours.    Dispense:  21 capsule    Refill:  0    Order Specific Question:   Supervising Provider    Answer:   Eustace Moore [[id number]]  . meloxicam (MOBIC) 15 MG tablet    Sig: Take 1 tablet (15 mg total) by mouth daily.    Dispense:  30 tablet    Refill:  0    Order Specific Question:   Supervising Provider    Answer:   Eustace Moore [[id number]]    Get plenty of rest and push fluids Tessalon Perles prescribed for cough Mobic for  body aches, chills, and fever.  Alternate with tylenol Use OTC zyrtec for nasal congestion, runny nose, and/or sore throat Use OTC flonase for nasal congestion and runny nose Use medications daily for symptom relief Call or go to the ED if you have any new or worsening symptoms such as fever, worsening cough, shortness of breath, chest tightness, chest pain, turning blue, changes in mental status, etc...   Reviewed expectations re: course of current medical issues. Questions answered. Outlined signs and symptoms indicating need for more acute intervention. Patient verbalized understanding. After Visit Summary given.   Patient returns requesting stronger cough medication      Rennis Harding, PA-C 05/03/20 6720    Alvino Chapel Geiger, PA-C 05/03/20 [phone]

## 2020-05-03 NOTE — Discharge Instructions (Addendum)
Get plenty of rest and push fluids Tessalon Perles prescribed for cough Mobic for body aches, chills, and fever.  Alternate with tylenol Use OTC zyrtec for nasal congestion, runny nose, and/or sore throat Use OTC flonase for nasal congestion and runny nose Use medications daily for symptom relief Call or go to the ED if you have any new or worsening symptoms such as fever, worsening cough, shortness of breath, chest tightness, chest pain, turning blue, changes in mental status, etc..Marland Kitchen

## 2020-05-03 NOTE — ED Triage Notes (Signed)
Pressure behind RT ear, cough and fever that wont go away x 8 days.  Son tested + for flu last week.

## 2020-05-05 ENCOUNTER — Other Ambulatory Visit: Payer: Self-pay

## 2020-05-05 ENCOUNTER — Ambulatory Visit
Admission: EM | Admit: 2020-05-05 | Discharge: 2020-05-05 | Disposition: A | Discharge status: Home / Self Care | Payer: No Typology Code available for payment source

## 2020-05-05 ENCOUNTER — Encounter: Payer: Self-pay | Admitting: *Deleted

## 2020-05-05 DIAGNOSIS — J0100 Acute maxillary sinusitis, unspecified: Secondary | ICD-10-CM

## 2020-05-05 DIAGNOSIS — R059 Cough, unspecified: Secondary | ICD-10-CM | POA: Diagnosis not present

## 2020-05-05 MED ORDER — CEFDINIR 300 MG PO CAPS: 300.0000 mg | ORAL_CAPSULE | Freq: Two times a day (BID) | ORAL | 0 refills | Status: AC

## 2020-05-05 MED ORDER — PREDNISONE 20 MG PO TABS: 40.0000 mg | ORAL_TABLET | Freq: Every day | ORAL | 0 refills | Status: DC

## 2020-05-05 NOTE — ED Triage Notes (Signed)
Pt was seen 4/29 for same; c/o cough, fevers up to 104, slight SOB.  C/O productive cough.  Has been taking acetaminophen.

## 2020-05-06 LAB — COVID-19, FLU A+B NAA
Influenza A, NAA: NOT DETECTED
Influenza B, NAA: NOT DETECTED
SARS-CoV-2, NAA: NOT DETECTED

## 2020-05-29 ENCOUNTER — Other Ambulatory Visit: Payer: Self-pay

## 2020-05-29 ENCOUNTER — Encounter: Payer: Self-pay | Admitting: Nurse Practitioner

## 2020-05-29 ENCOUNTER — Ambulatory Visit (INDEPENDENT_AMBULATORY_CARE_PROVIDER_SITE_OTHER): Payer: No Typology Code available for payment source | Admitting: Nurse Practitioner

## 2020-05-29 ENCOUNTER — Other Ambulatory Visit (HOSPITAL_COMMUNITY): Payer: Self-pay

## 2020-05-29 VITALS — HR 98 | Temp 98.6°F

## 2020-05-29 DIAGNOSIS — J0140 Acute pansinusitis, unspecified: Secondary | ICD-10-CM | POA: Diagnosis not present

## 2020-05-29 DIAGNOSIS — R058 Other specified cough: Secondary | ICD-10-CM | POA: Diagnosis not present

## 2020-05-29 MED ORDER — DOXYCYCLINE HYCLATE 100 MG PO TABS
100.0000 mg | ORAL_TABLET | Freq: Two times a day (BID) | ORAL | 0 refills | Status: DC
  Filled 2020-05-29: qty 14, 7d supply, fill #0

## 2020-05-29 NOTE — Progress Notes (Signed)
Subjective:    Patient ID: Joshua Ho, male    DOB: 06-26-1980, 40 y.o.   MRN: [id number]  HPI: Joshua Ho is a 40 y.o. male presenting Via parking lot visit due to COVID-19 pandemic for cough and congestion.  Chief Complaint  Patient presents with  . Sinus Problem    Had flu 1 month ago, fever for 13 days per pt, took pred and doxy. Pt records from UC visit is in [place]. Took last covid test 2 wks  (-). Still having the drainage, not much cough. However has pain in chest when he does cough. Thinks he may want imaging to make sure of no injury.Taking mucinex and nasal spray.    UPPER RESPIRATORY TRACT INFECTION Onset: mid April COVID-19 testing history: tested for COVID early May; negative Fever: no Cough: yes; productive yellow sputum Shortness of breath: no Wheezing: no Chest pain: yes, with cough; radiating to left back Pain with inspiration: yes Chest tightness: no Chest congestion: no Nasal congestion: yes; producing thick yellow sputum Runny nose: no Post nasal drip: yes Sneezing: no Sore throat: no Swollen glands: no Sinus pressure: yes; right worse than left Headache: yes, over right eye Face pain: no Toothache: no Ear pain: no  Ear pressure: no  Eyes red/itching:no Eye drainage/crusting: no  Nausea: yes  Vomiting: no Diarrhea: no  Change in appetite: no  Loss of taste/smell: no  Rash: no Fatigue: no Sick contacts: yes; daughter had flu at beginning of month Strep contacts: no  Context: worse Recurrent sinusitis: no Treatments attempted: Mucinex, flonase, Vick's nasal spray, Tylenol; treated with Cefdinir and prednisone in Urgent Care Relief with OTC medications: some at first  Allergies  Allergen Reactions  . Penicillins Rash    Outpatient Encounter Medications as of 05/29/2020  Medication Sig Note  . ACETAMINOPHEN PO Take by mouth.   . clobetasol cream (TEMOVATE) 0.05 % Apply 1 application topically 2 (two) times daily.   . clobetasol  cream (TEMOVATE) 0.05 % APPLY 1 APPLICATION TOPICALLY 2 (TWO) TIMES DAILY.   Marland Kitchen doxycycline (VIBRA-TABS) 100 MG tablet Take 1 tablet (100 mg total) by mouth 2 (two) times daily.   . IBUPROFEN PO Take by mouth.   . meloxicam (MOBIC) 15 MG tablet Take 1 tablet (15 mg total) by mouth daily. 05/05/2020: States made him sick  . [DISCONTINUED] benzonatate (TESSALON) 100 MG capsule Take 1 capsule (100 mg total) by mouth every 8 (eight) hours.   . [DISCONTINUED] HYDROcodone bit-homatropine (HYCODAN) 5-1.5 MG/5ML syrup Take 5 mLs by mouth every 12 (twelve) hours as needed for cough.   . [DISCONTINUED] predniSONE (DELTASONE) 20 MG tablet Take 2 tablets (40 mg total) by mouth daily with breakfast.    No facility-administered encounter medications on file as of 05/29/2020.    Patient Active Problem List   Diagnosis Date Noted  . Gastritis 09/20/2013  . Dizziness and giddiness 09/20/2013  . Allergic dermatitis 07/10/2013  . GERD (gastroesophageal reflux disease) 04/26/2013  . Routine general medical examination at a health care facility 07/26/2011    Past Medical History:  Diagnosis Date  . Allergy   . Atopic dermatitis of eyelid   . GERD (gastroesophageal reflux disease)     Relevant past medical, surgical, family and social history reviewed and updated as indicated. Interim medical history since our last visit reviewed.  Review of Systems Per HPI unless specifically indicated above     Objective:    Pulse 98   Temp 98.6 F (37  C) (Oral)   SpO2 95%   Wt Readings from Last 3 Encounters:  02/28/20 170 lb (77.1 kg)  01/17/19 167 lb (75.8 kg)  08/03/17 165 lb 6.4 oz (75 kg)    Physical Exam Vitals and nursing note reviewed.  Constitutional:      General: He is not in acute distress.    Appearance: Normal appearance. He is not toxic-appearing.  HENT:     Head: Normocephalic and atraumatic.     Right Ear: External ear normal.     Left Ear: External ear normal.     Nose: Nose normal. No  congestion.     Mouth/Throat:     Mouth: Mucous membranes are moist.     Pharynx: Oropharynx is clear. No oropharyngeal exudate or posterior oropharyngeal erythema.  Eyes:     General: No scleral icterus.    Extraocular Movements: Extraocular movements intact.  Cardiovascular:     Rate and Rhythm: Normal rate.     Heart sounds: Normal heart sounds.  Pulmonary:     Effort: Pulmonary effort is normal. No respiratory distress.     Breath sounds: Normal breath sounds. No wheezing, rhonchi or rales.  Musculoskeletal:     Cervical back: Normal range of motion.  Lymphadenopathy:     Cervical: No cervical adenopathy.  Skin:    General: Skin is warm and dry.     Capillary Refill: Capillary refill takes less than 2 seconds.     Coloration: Skin is not jaundiced or pale.     Findings: No erythema.  Neurological:     Mental Status: He is alert and oriented to person, place, and time.  Psychiatric:        Mood and Affect: Mood normal.        Behavior: Behavior normal.        Thought Content: Thought content normal.        Judgment: Judgment normal.     Results for orders placed or performed during the hospital encounter of 05/05/20  Covid-19, Flu A+B ([facility])   Specimen: Nasopharyngeal   Naso  Result Value Ref Range   SARS-CoV-2, NAA Not Detected Not Detected   Influenza A, NAA Not Detected Not Detected   Influenza B, NAA Not Detected Not Detected      Assessment & Plan:  1. Subacute pansinusitis Acute.  Coupled with ongoing purulent drainage, likely remaining bacterial sinusitis.  Will treat with doxycycline 100 mg twice daily for 7 days.  If not improving after this course of antibiotics, consider referral to ENT versus CAT scan of sinuses to rule out underlying etiology.  - doxycycline (VIBRA-TABS) 100 MG tablet; Take 1 tablet (100 mg total) by mouth 2 (two) times daily.  Dispense: 14 tablet; Refill: 0  2. Productive cough Acute.  Concern with pleurisy.  Will check chest x-ray  to rule out acute pneumonia.  In meantime, treat sinusitis with doxycycline which also covers for atypical pneumonia.  Follow-up pending chest x-ray results.  - DG Chest 2 View; Future    Follow up plan: Return if symptoms worsen or fail to improve.

## 2020-05-30 ENCOUNTER — Other Ambulatory Visit (HOSPITAL_COMMUNITY): Payer: Self-pay

## 2020-05-30 MED ORDER — LEVOFLOXACIN 750 MG PO TABS
750.0000 mg | ORAL_TABLET | Freq: Every day | ORAL | 0 refills | Status: DC
  Filled 2020-05-30: qty 7, 7d supply, fill #0

## 2020-05-31 ENCOUNTER — Other Ambulatory Visit: Payer: Self-pay

## 2020-05-31 ENCOUNTER — Ambulatory Visit (HOSPITAL_COMMUNITY)
Admission: RE | Admit: 2020-05-31 | Discharge: 2020-05-31 | Disposition: A | Discharge status: Home / Self Care | Payer: No Typology Code available for payment source | Source: Ambulatory Visit | Attending: Nurse Practitioner | Admitting: Nurse Practitioner

## 2020-05-31 DIAGNOSIS — R058 Other specified cough: Secondary | ICD-10-CM | POA: Diagnosis not present

## 2020-06-17 ENCOUNTER — Encounter: Payer: Self-pay | Admitting: Nurse Practitioner

## 2020-06-17 DIAGNOSIS — J329 Chronic sinusitis, unspecified: Secondary | ICD-10-CM

## 2020-06-17 DIAGNOSIS — J0140 Acute pansinusitis, unspecified: Secondary | ICD-10-CM

## 2020-06-19 ENCOUNTER — Other Ambulatory Visit (HOSPITAL_COMMUNITY): Payer: Self-pay

## 2020-06-19 MED ORDER — DOXYCYCLINE HYCLATE 100 MG PO TABS
100.0000 mg | ORAL_TABLET | Freq: Two times a day (BID) | ORAL | 0 refills | Status: DC
  Filled 2020-06-19: qty 14, 7d supply, fill #0

## 2020-06-19 NOTE — Telephone Encounter (Signed)
Yes please - diagnosis chronic sinusitis and we can try another round of doxycycline in the meantime.  Please send in doxycycline 100 mg 1 PO q 12 hours for 7 days #14 no refills.

## 2020-06-26 ENCOUNTER — Other Ambulatory Visit: Payer: Self-pay

## 2020-06-26 DIAGNOSIS — R930 Abnormal findings on diagnostic imaging of skull and head, not elsewhere classified: Secondary | ICD-10-CM

## 2020-06-26 DIAGNOSIS — J0140 Acute pansinusitis, unspecified: Secondary | ICD-10-CM

## 2020-06-26 DIAGNOSIS — J329 Chronic sinusitis, unspecified: Secondary | ICD-10-CM

## 2020-06-27 ENCOUNTER — Other Ambulatory Visit (HOSPITAL_COMMUNITY): Payer: Self-pay

## 2020-06-27 NOTE — Progress Notes (Signed)
CT image ordered for pt, pt is aware

## 2020-07-19 ENCOUNTER — Other Ambulatory Visit: Payer: Self-pay

## 2020-07-19 DIAGNOSIS — R930 Abnormal findings on diagnostic imaging of skull and head, not elsewhere classified: Secondary | ICD-10-CM

## 2020-07-19 NOTE — ED Provider Notes (Addendum)
[facility]    CSN: [id number] Arrival date & time: 05/05/20  1514      History   Chief Complaint Chief Complaint  Patient presents with   Cough   Fever    HPI Joshua Ho is a 40 y.o. male.   HPI Patient presents with URI symptoms including cough, nasal congestion, runny nose, and sinus pressure. See here on 05/03/2020. Daughter tested positive for FLU. Patient tested for not tested for COVID or Flu. Complains of SOB. Since seen 05/03/20. Requested x-ray concern for PNA Reports fever up to 104, afebrile at present. No history of asthma or recurrent bronchitis. Past Medical History:  Diagnosis Date   Allergy    Atopic dermatitis of eyelid    GERD (gastroesophageal reflux disease)     Patient Active Problem List   Diagnosis Date Noted   Gastritis 09/20/2013   Dizziness and giddiness 09/20/2013   Allergic dermatitis 07/10/2013   GERD (gastroesophageal reflux disease) 04/26/2013   Routine general medical examination at a health care facility 07/26/2011    Past Surgical History:  Procedure Laterality Date   ESOPHAGOGASTRODUODENOSCOPY N/A 10/20/2013   Procedure: ESOPHAGOGASTRODUODENOSCOPY (EGD);  Surgeon: Malissa Hippo, MD;  Location: [facility];  Service: Endoscopy;  Laterality: N/A;  855   WISDOM TOOTH EXTRACTION     x 4       Home Medications    Prior to Admission medications   Medication Sig Start Date End Date Taking? Authorizing Provider  ACETAMINOPHEN PO Take by mouth.   Yes [provider]  IBUPROFEN PO Take by mouth.   Yes [provider]  clobetasol cream (TEMOVATE) 0.05 % Apply 1 application topically 2 (two) times daily. 02/28/20   Selma, Velna Hatchet, MD  clobetasol cream (TEMOVATE) 0.05 % APPLY 1 APPLICATION TOPICALLY 2 (TWO) TIMES DAILY. 02/28/20 02/27/21  Salley Scarlet, MD  doxycycline (VIBRA-TABS) 100 MG tablet Take 1 tablet (100 mg total) by mouth 2 (two) times daily. 06/19/20   Donita Brooks, MD   levofloxacin (LEVAQUIN) 750 MG tablet Take 1 tablet (750 mg total) by mouth daily. 05/30/20   Valentino Nose, NP  meloxicam (MOBIC) 15 MG tablet Take 1 tablet (15 mg total) by mouth daily. 05/03/20   Rennis Harding, PA-C    Family History Family History  Problem Relation Age of Onset   Hyperlipidemia Father     Social History Social History   Tobacco Use   Smoking status: Former    Packs/day: 0.75    Years: 6.00    Pack years: 4.50    Types: Cigarettes    Quit date: 07/11/2009    Years since quitting: 11.0   Smokeless tobacco: Former   Tobacco comments:    quit 2011  Vaping Use   Vaping Use: Never used  Substance Use Topics   Alcohol use: No    Alcohol/week: 0.0 standard drinks   Drug use: Not Currently     Allergies   Penicillins   Review of Systems Review of Systems Pertinent negatives listed in HPI   Physical Exam Triage Vital Signs ED Triage Vitals  Enc Vitals Group     BP 05/05/20 1616 117/75     Pulse Rate 05/05/20 1616 97     Resp 05/05/20 1616 16     Temp 05/05/20 1616 98.9 F (37.2 C)     Temp Source 05/05/20 1616 Tympanic     SpO2 05/05/20 1616 97 %     Weight --  Height --      Head Circumference --      Peak Flow --      Pain Score 05/05/20 1631 9     Pain Loc --      Pain Edu? --      Excl. in GC? --    No data found.  Updated Vital Signs BP 117/75 (BP Location: Right Arm)   Pulse 97   Temp 98.9 F (37.2 C) (Tympanic)   Resp 16   SpO2 97%   Visual Acuity Right Eye Distance:   Left Eye Distance:   Bilateral Distance:    Right Eye Near:   Left Eye Near:    Bilateral Near:     Physical Exam  General Appearance:    Alert, cooperative, no distress  HENT:   Normocephalic, ears normal, nares mucosal edema with congestion, rhinorrhea, oropharynx w/o exudate  Eyes:    PERRL, conjunctiva/corneas clear, EOM's intact       Lungs:    Clear to auscultation bilaterally, respirations unlabored, no wheezing or rales   Heart:     Regular rate and rhythm  Neurologic:   Awake, alert, oriented x 3. No apparent focal neurological           defect.     UC Treatments / Results  Labs (all labs ordered are listed, but only abnormal results are displayed) Labs Reviewed  COVID-19, FLU A+B NAA   Narrative:    Test(s) 140142-Influenza A, NAA; 140143-Influenza B, NAA was developed and its performance characteristics determined by [facility]. It has not been cleared or approved by the Food and Drug Administration. Performed at:  [address], [place], [place]  [id number] Lab Director: Jolene Schimke MD, Phone:  [phone]    EKG   Radiology No results found.  Procedures Procedures (including critical care time)  Medications Ordered in UC Medications - No data to display  Initial Impression / Assessment and Plan / UC Course  I have reviewed the triage vital signs and the nursing notes.  Pertinent labs & imaging results that were available during my care of the patient were reviewed by me and considered in my medical decision making (see chart for details).    COVID/Flu test pending. Symptom management warranted only. Continue with symptom management. Will cover for acute sinusitis given the duration of URI symptoms are greater than 7 days and have not improved with conservative therapy. Manage fever with Tylenol and ibuprofen.  Nasal symptoms with over-the-counter antihistamines recommended.  Treatment per discharge medications/discharge instructions.  Red flags/ER precautions given. The most current CDC isolation/quarantine recommendation advised.   Final Clinical Impressions(s) / UC Diagnoses   Final diagnoses:  Acute non-recurrent maxillary sinusitis  Cough   Discharge Instructions   None    ED Prescriptions     Medication Sig Dispense Auth. Provider   cefdinir (OMNICEF) 300 MG capsule Take 1 capsule (300 mg total) by mouth 2 (two) times daily for 10 days. 20 capsule Bing Neighbors, FNP   predniSONE (DELTASONE) 20 MG tablet Take 2 tablets (40 mg total) by mouth daily with breakfast. 10 tablet Bing Neighbors, FNP      PDMP not reviewed this encounter.   Bing Neighbors, FNP 07/19/20 1501    Bing Neighbors, FNP 07/19/20 1501

## 2020-07-23 ENCOUNTER — Ambulatory Visit (HOSPITAL_COMMUNITY): Payer: No Typology Code available for payment source

## 2020-07-25 ENCOUNTER — Ambulatory Visit (HOSPITAL_COMMUNITY)
Admission: RE | Admit: 2020-07-25 | Discharge: 2020-07-25 | Disposition: A | Discharge status: Home / Self Care | Payer: No Typology Code available for payment source | Source: Ambulatory Visit | Attending: Nurse Practitioner | Admitting: Nurse Practitioner

## 2020-07-25 ENCOUNTER — Other Ambulatory Visit: Payer: Self-pay

## 2020-07-25 DIAGNOSIS — R930 Abnormal findings on diagnostic imaging of skull and head, not elsewhere classified: Secondary | ICD-10-CM | POA: Insufficient documentation

## 2020-07-25 MED ORDER — IOHEXOL 350 MG/ML SOLN
75.0000 mL | Freq: Once | INTRAVENOUS | Status: AC | PRN
  Administered 2020-07-25: 75 mL via INTRAVENOUS

## 2020-07-29 ENCOUNTER — Ambulatory Visit (INDEPENDENT_AMBULATORY_CARE_PROVIDER_SITE_OTHER): Payer: No Typology Code available for payment source | Admitting: Otolaryngology

## 2020-07-30 ENCOUNTER — Other Ambulatory Visit: Payer: Self-pay | Admitting: Otolaryngology

## 2020-08-09 ENCOUNTER — Ambulatory Visit (HOSPITAL_COMMUNITY): Payer: No Typology Code available for payment source

## 2020-08-26 ENCOUNTER — Encounter (HOSPITAL_BASED_OUTPATIENT_CLINIC_OR_DEPARTMENT_OTHER): Payer: Self-pay | Admitting: Otolaryngology

## 2020-08-26 ENCOUNTER — Other Ambulatory Visit: Payer: Self-pay

## 2020-09-03 ENCOUNTER — Ambulatory Visit (HOSPITAL_BASED_OUTPATIENT_CLINIC_OR_DEPARTMENT_OTHER): Payer: No Typology Code available for payment source | Admitting: Anesthesiology

## 2020-09-03 ENCOUNTER — Encounter (HOSPITAL_BASED_OUTPATIENT_CLINIC_OR_DEPARTMENT_OTHER)
Admission: RE | Disposition: A | Discharge status: Home / Self Care | Payer: Self-pay | Source: Home / Self Care | Attending: Otolaryngology

## 2020-09-03 ENCOUNTER — Other Ambulatory Visit (HOSPITAL_COMMUNITY): Payer: Self-pay

## 2020-09-03 ENCOUNTER — Ambulatory Visit (HOSPITAL_BASED_OUTPATIENT_CLINIC_OR_DEPARTMENT_OTHER)
Admission: RE | Admit: 2020-09-03 | Discharge: 2020-09-03 | Disposition: A | Discharge status: Home / Self Care | Payer: No Typology Code available for payment source | Attending: Otolaryngology | Admitting: Otolaryngology

## 2020-09-03 ENCOUNTER — Encounter (HOSPITAL_BASED_OUTPATIENT_CLINIC_OR_DEPARTMENT_OTHER): Payer: Self-pay | Admitting: Otolaryngology

## 2020-09-03 ENCOUNTER — Other Ambulatory Visit: Payer: Self-pay

## 2020-09-03 DIAGNOSIS — Z87891 Personal history of nicotine dependence: Secondary | ICD-10-CM | POA: Insufficient documentation

## 2020-09-03 DIAGNOSIS — J321 Chronic frontal sinusitis: Secondary | ICD-10-CM | POA: Diagnosis not present

## 2020-09-03 DIAGNOSIS — J320 Chronic maxillary sinusitis: Secondary | ICD-10-CM | POA: Diagnosis not present

## 2020-09-03 DIAGNOSIS — J322 Chronic ethmoidal sinusitis: Secondary | ICD-10-CM | POA: Diagnosis not present

## 2020-09-03 DIAGNOSIS — J3489 Other specified disorders of nose and nasal sinuses: Secondary | ICD-10-CM | POA: Insufficient documentation

## 2020-09-03 DIAGNOSIS — J343 Hypertrophy of nasal turbinates: Secondary | ICD-10-CM | POA: Diagnosis present

## 2020-09-03 HISTORY — PX: TURBINATE REDUCTION: SHX6157

## 2020-09-03 HISTORY — PX: ETHMOIDECTOMY: SHX5197

## 2020-09-03 HISTORY — PX: NASAL SINUS SURGERY: SHX719

## 2020-09-03 HISTORY — PX: SINUS ENDO WITH FUSION: SHX5329

## 2020-09-03 SURGERY — SURGERY, PARANASAL SINUS, ENDOSCOPIC, WITH NASAL SEPTOPLASTY, TURBINOPLASTY, AND MAXILLARY SINUSOTOMY
Anesthesia: General | Site: Nose | Laterality: Right

## 2020-09-03 MED ORDER — ROCURONIUM BROMIDE 100 MG/10ML IV SOLN
INTRAVENOUS | Status: DC | PRN
  Administered 2020-09-03: 80 mg via INTRAVENOUS

## 2020-09-03 MED ORDER — LACTATED RINGERS IV SOLN: INTRAVENOUS | Status: DC

## 2020-09-03 MED ORDER — ONDANSETRON HCL 4 MG/2ML IJ SOLN
4.0000 mg | Freq: Once | INTRAMUSCULAR | Status: AC
  Administered 2020-09-03: 4 mg via INTRAVENOUS

## 2020-09-03 MED ORDER — SUGAMMADEX SODIUM 200 MG/2ML IV SOLN
INTRAVENOUS | Status: DC | PRN
  Administered 2020-09-03: 200 mg via INTRAVENOUS

## 2020-09-03 MED ORDER — 0.9 % SODIUM CHLORIDE (POUR BTL) OPTIME
TOPICAL | Status: DC | PRN
  Administered 2020-09-03: 100 mL

## 2020-09-03 MED ORDER — LIDOCAINE-EPINEPHRINE 1 %-1:100000 IJ SOLN
INTRAMUSCULAR | Status: AC
  Filled 2020-09-03: qty 1

## 2020-09-03 MED ORDER — FENTANYL CITRATE (PF) 100 MCG/2ML IJ SOLN
INTRAMUSCULAR | Status: DC | PRN
  Administered 2020-09-03: 100 ug via INTRAVENOUS
  Administered 2020-09-03 (×2): 50 ug via INTRAVENOUS

## 2020-09-03 MED ORDER — LIDOCAINE HCL (PF) 2 % IJ SOLN
INTRAMUSCULAR | Status: AC
  Filled 2020-09-03: qty 5

## 2020-09-03 MED ORDER — LEVOFLOXACIN 500 MG PO TABS
500.0000 mg | ORAL_TABLET | Freq: Every day | ORAL | 0 refills | Status: AC
  Filled 2020-09-03: qty 5, 5d supply, fill #0

## 2020-09-03 MED ORDER — PROPOFOL 10 MG/ML IV BOLUS
INTRAVENOUS | Status: AC
  Filled 2020-09-03: qty 20

## 2020-09-03 MED ORDER — HYDROMORPHONE HCL 1 MG/ML IJ SOLN
INTRAMUSCULAR | Status: AC
  Filled 2020-09-03: qty 0.5

## 2020-09-03 MED ORDER — HYDROMORPHONE HCL 1 MG/ML IJ SOLN
0.2500 mg | INTRAMUSCULAR | Status: DC | PRN
  Administered 2020-09-03 (×2): 0.5 mg via INTRAVENOUS

## 2020-09-03 MED ORDER — ONDANSETRON HCL 4 MG/2ML IJ SOLN
INTRAMUSCULAR | Status: AC
  Filled 2020-09-03: qty 2

## 2020-09-03 MED ORDER — DEXAMETHASONE SODIUM PHOSPHATE 10 MG/ML IJ SOLN
INTRAMUSCULAR | Status: AC
  Filled 2020-09-03: qty 1

## 2020-09-03 MED ORDER — PROMETHAZINE HCL 25 MG/ML IJ SOLN: 6.2500 mg | INTRAMUSCULAR | Status: DC | PRN

## 2020-09-03 MED ORDER — OXYCODONE HCL 5 MG PO TABS
ORAL_TABLET | ORAL | Status: AC
  Filled 2020-09-03: qty 1

## 2020-09-03 MED ORDER — OXYCODONE-ACETAMINOPHEN 5-325 MG PO TABS
1.0000 | ORAL_TABLET | ORAL | 0 refills | Status: AC | PRN
  Filled 2020-09-03: qty 12, 2d supply, fill #0

## 2020-09-03 MED ORDER — MEPERIDINE HCL 25 MG/ML IJ SOLN: 6.2500 mg | INTRAMUSCULAR | Status: DC | PRN

## 2020-09-03 MED ORDER — OXYCODONE HCL 5 MG PO TABS
5.0000 mg | ORAL_TABLET | Freq: Once | ORAL | Status: AC | PRN
  Administered 2020-09-03: 5 mg via ORAL

## 2020-09-03 MED ORDER — FENTANYL CITRATE (PF) 100 MCG/2ML IJ SOLN
INTRAMUSCULAR | Status: AC
  Filled 2020-09-03: qty 2

## 2020-09-03 MED ORDER — OXYMETAZOLINE HCL 0.05 % NA SOLN
NASAL | Status: AC
  Filled 2020-09-03: qty 30

## 2020-09-03 MED ORDER — SODIUM CHLORIDE 0.9 % IV SOLN
INTRAVENOUS | Status: AC | PRN
  Administered 2020-09-03: 100 mL

## 2020-09-03 MED ORDER — MIDAZOLAM HCL 5 MG/5ML IJ SOLN
INTRAMUSCULAR | Status: DC | PRN
  Administered 2020-09-03: 2 mg via INTRAVENOUS

## 2020-09-03 MED ORDER — LIDOCAINE HCL (CARDIAC) PF 100 MG/5ML IV SOSY
PREFILLED_SYRINGE | INTRAVENOUS | Status: DC | PRN
  Administered 2020-09-03: 80 mg via INTRAVENOUS

## 2020-09-03 MED ORDER — MIDAZOLAM HCL 2 MG/2ML IJ SOLN
INTRAMUSCULAR | Status: AC
  Filled 2020-09-03: qty 2

## 2020-09-03 MED ORDER — ONDANSETRON HCL 4 MG/2ML IJ SOLN
INTRAMUSCULAR | Status: DC | PRN
  Administered 2020-09-03: 4 mg via INTRAVENOUS

## 2020-09-03 MED ORDER — MUPIROCIN 2 % EX OINT
TOPICAL_OINTMENT | CUTANEOUS | Status: DC | PRN
  Administered 2020-09-03: 1 via TOPICAL

## 2020-09-03 MED ORDER — AMISULPRIDE (ANTIEMETIC) 5 MG/2ML IV SOLN: 10.0000 mg | Freq: Once | INTRAVENOUS | Status: DC | PRN

## 2020-09-03 MED ORDER — PROPOFOL 10 MG/ML IV BOLUS
INTRAVENOUS | Status: DC | PRN
  Administered 2020-09-03: 160 mg via INTRAVENOUS

## 2020-09-03 MED ORDER — OXYMETAZOLINE HCL 0.05 % NA SOLN
NASAL | Status: DC | PRN
  Administered 2020-09-03: 1 via TOPICAL

## 2020-09-03 MED ORDER — OXYCODONE HCL 5 MG/5ML PO SOLN: 5.0000 mg | Freq: Once | ORAL | Status: AC | PRN

## 2020-09-03 MED ORDER — MUPIROCIN 2 % EX OINT
TOPICAL_OINTMENT | CUTANEOUS | Status: AC
  Filled 2020-09-03: qty 22

## 2020-09-03 MED ORDER — DEXAMETHASONE SODIUM PHOSPHATE 4 MG/ML IJ SOLN
INTRAMUSCULAR | Status: DC | PRN
  Administered 2020-09-03: 10 mg via INTRAVENOUS

## 2020-09-03 SURGICAL SUPPLY — 57 items
ATTRACTOMAT 16X20 MAGNETIC DRP (DRAPES) IMPLANT
BLADE RAD40 ROTATE 4M 4 5PK (BLADE) IMPLANT
BLADE RAD60 ROTATE M4 4 5PK (BLADE) IMPLANT
BLADE ROTATE RAD 12 4 M4 (BLADE) IMPLANT
BLADE ROTATE RAD 40 4 M4 (BLADE) IMPLANT
BLADE ROTATE TRICUT 4X13 M4 (BLADE) ×3 IMPLANT
BLADE TRICUT ROTATE M4 4 5PK (BLADE) IMPLANT
BUR HS RAD FRONTAL 3 (BURR) IMPLANT
CANISTER SUC SOCK COL 7IN (MISCELLANEOUS) ×3 IMPLANT
CANISTER SUCT 1200ML W/VALVE (MISCELLANEOUS) ×6 IMPLANT
COAGULATOR SUCT 8FR VV (MISCELLANEOUS) ×1 IMPLANT
COAGULATOR SUCT SWTCH 10FR 6 (ELECTROSURGICAL) IMPLANT
DECANTER SPIKE VIAL GLASS SM (MISCELLANEOUS) IMPLANT
DEFOGGER MIRROR 1QT (MISCELLANEOUS) ×5 IMPLANT
DRSG NASAL KENNEDY LMNT 8CM (GAUZE/BANDAGES/DRESSINGS) IMPLANT
DRSG NASOPORE 8CM (GAUZE/BANDAGES/DRESSINGS) ×1 IMPLANT
DRSG TELFA 3X8 NADH (GAUZE/BANDAGES/DRESSINGS) IMPLANT
ELECT REM PT RETURN 9FT ADLT (ELECTROSURGICAL) ×3
ELECTRODE REM PT RTRN 9FT ADLT (ELECTROSURGICAL) ×2 IMPLANT
GLOVE SURG ENC MOIS LTX SZ7.5 (GLOVE) ×3 IMPLANT
GLOVE SURG POLYISO LF SZ6 (GLOVE) ×1 IMPLANT
GLOVE SURG POLYISO LF SZ7 (GLOVE) ×1 IMPLANT
GLOVE SURG UNDER POLY LF SZ6 (GLOVE) ×1 IMPLANT
GLOVE SURG UNDER POLY LF SZ7 (GLOVE) ×1 IMPLANT
GOWN STRL REUS W/ TWL LRG LVL3 (GOWN DISPOSABLE) ×4 IMPLANT
GOWN STRL REUS W/TWL LRG LVL3 (GOWN DISPOSABLE) ×9
HEMOSTAT SURGICEL 2X14 (HEMOSTASIS) IMPLANT
IV NS 500ML (IV SOLUTION) ×3
IV NS 500ML BAXH (IV SOLUTION) ×2 IMPLANT
IV SET EXT 30 76VOL 4 MALE LL (IV SETS) IMPLANT
NDL HYPO 25X1 1.5 SAFETY (NEEDLE) ×2 IMPLANT
NDL SPNL 25GX3.5 QUINCKE BL (NEEDLE) IMPLANT
NEEDLE HYPO 25X1 1.5 SAFETY (NEEDLE) IMPLANT
NEEDLE SPNL 25GX3.5 QUINCKE BL (NEEDLE) IMPLANT
NS IRRIG 1000ML POUR BTL (IV SOLUTION) ×3 IMPLANT
PACK BASIN DAY SURGERY FS (CUSTOM PROCEDURE TRAY) ×3 IMPLANT
PACK ENT DAY SURGERY (CUSTOM PROCEDURE TRAY) ×3 IMPLANT
PAD DRESSING TELFA 3X8 NADH (GAUZE/BANDAGES/DRESSINGS) IMPLANT
PATTIES SURGICAL .5 X3 (DISPOSABLE) IMPLANT
SLEEVE SCD COMPRESS KNEE MED (STOCKING) ×3 IMPLANT
SPLINT NASAL AIRWAY SILICONE (MISCELLANEOUS) IMPLANT
SPONGE GAUZE 2X2 8PLY STRL LF (GAUZE/BANDAGES/DRESSINGS) ×3 IMPLANT
SPONGE NEURO XRAY DETECT 1X3 (DISPOSABLE) ×3 IMPLANT
SUCTION FRAZIER HANDLE 10FR (MISCELLANEOUS)
SUCTION TUBE FRAZIER 10FR DISP (MISCELLANEOUS) IMPLANT
SUT CHROMIC 4 0 P 3 18 (SUTURE) IMPLANT
SUT ETHILON 3 0 PS 1 (SUTURE) IMPLANT
SUT PLAIN 4 0 ~~LOC~~ 1 (SUTURE) IMPLANT
SUT PROLENE 3 0 PS 2 (SUTURE) IMPLANT
SYR 50ML LL SCALE MARK (SYRINGE) IMPLANT
TOWEL GREEN STERILE FF (TOWEL DISPOSABLE) ×3 IMPLANT
TRACKER ENT INSTRUMENT (MISCELLANEOUS) ×3 IMPLANT
TRACKER ENT PATIENT (MISCELLANEOUS) ×3 IMPLANT
TUBE CONNECTING 20X1/4 (TUBING) ×3 IMPLANT
TUBE SALEM SUMP 16 FR W/ARV (TUBING) ×1 IMPLANT
TUBING STRAIGHTSHOT EPS 5PK (TUBING) ×3 IMPLANT
YANKAUER SUCT BULB TIP NO VENT (SUCTIONS) ×3 IMPLANT

## 2020-09-03 NOTE — Anesthesia Preprocedure Evaluation (Signed)
Anesthesia Evaluation  Patient identified by MRN, date of birth, ID band Patient awake    Reviewed: Allergy & Precautions, NPO status , Patient's Chart, lab work & pertinent test results  Airway Mallampati: II  TM Distance: >3 FB Neck ROM: Full    Dental no notable dental hx.    Pulmonary neg pulmonary ROS, former smoker,    Pulmonary exam normal breath sounds clear to auscultation       Cardiovascular negative cardio ROS Normal cardiovascular exam Rhythm:Regular Rate:Normal     Neuro/Psych negative neurological ROS  negative psych ROS   GI/Hepatic Neg liver ROS, GERD  ,  Endo/Other  negative endocrine ROS  Renal/GU negative Renal ROS  negative genitourinary   Musculoskeletal negative musculoskeletal ROS (+)   Abdominal   Peds negative pediatric ROS (+)  Hematology negative hematology ROS (+)   Anesthesia Other Findings   Reproductive/Obstetrics negative OB ROS                             Anesthesia Physical Anesthesia Plan  ASA: 2  Anesthesia Plan: General   Post-op Pain Management:    Induction: Intravenous  PONV Risk Score and Plan: 2 and Ondansetron, Midazolam and Treatment may vary due to age or medical condition  Airway Management Planned: Oral ETT  Additional Equipment:   Intra-op Plan:   Post-operative Plan: Extubation in OR  Informed Consent: I have reviewed the patients History and Physical, chart, labs and discussed the procedure including the risks, benefits and alternatives for the proposed anesthesia with the patient or authorized representative who has indicated his/her understanding and acceptance.     Dental advisory given  Plan Discussed with: CRNA  Anesthesia Plan Comments:         Anesthesia Quick Evaluation

## 2020-09-03 NOTE — H&P (Signed)
Cc: Chronic sinusitis and nasal obstruction  HPI: The patient is a 40 y/o male who presents today for evaluation of recurrent sinus infections.  The patient noted onset of symptoms in March. He notes right sided facial pressure with congestion and purulent drainage. He notes a constant bad taste and smell coming from his nose. The patient has been on 4 antibiotics and several prednisone tapers with no relief. He underwent a sinus CT a few days ago which showed opacification of the right frontal, ethmoid, and maxillary sinuses. Bilateral inferior turbinate hypertrophy was also noted. Previous ENT surgery is denied.   The patient's review of systems (constitutional, eyes, ENT, cardiovascular, respiratory, GI, musculoskeletal, skin, neurologic, psychiatric, endocrine, hematologic, allergic) is noted in the ROS questionnaire.  It is reviewed with the patient.   Family health history: No HTN, DM, CAD, hearing loss or bleeding disorder.  Major events: Wisdom teeth extraction.  Ongoing medical problems: None.  Social history: The patient is married. He is a former smoker. He denies the use of alcohol or illegal drugs.   Exam: General: Communicates without difficulty, well nourished, no acute distress. Head: Normocephalic, no evidence injury, no tenderness, facial buttresses intact without stepoff. Eyes: PERRL, EOMI. No scleral icterus, conjunctivae clear. Neuro: CN II exam reveals vision grossly intact.  No nystagmus at any point of gaze. Ears: Auricles well formed without lesions.  Ear canals are intact without mass or lesion.  No erythema or edema is appreciated.  The TMs are intact without fluid. Nose: External evaluation reveals normal support and skin without lesions.  Dorsum is intact.  Anterior rhinoscopy reveals congested and edematous mucosa over anterior aspect of the inferior turbinates and nasal septum.  No purulence is noted. Middle meatus is not well visualized. Oral:  Oral cavity and oropharynx  are intact, symmetric, without erythema or edema.  Mucosa is moist without lesions. Neck: Full range of motion without pain.  There is no significant lymphadenopathy.  No masses palpable.  Thyroid bed within normal limits to palpation.  Parotid glands and submandibular glands equal bilaterally without mass.  Trachea is midline. Neuro:  CN 2-12 grossly intact. Gait normal. Vestibular: No nystagmus at any point of gaze. A flexible scope was inserted into the right nasal cavity. Endoscopy of the interior nasal cavity, superior, inferior, and middle meatus was performed. The sphenoid-ethmoid recess was examined. Edematous mucosa was noted with mucopurulent drainage. No polyp, mass, or lesion was appreciated. Olfactory cleft was clear. Nasopharynx was clear. Turbinates were hypertrophied but without mass. Incomplete response to decongestion. The procedure was repeated on the contralateral side with similar findings. The patient tolerated the procedure well.   Assessment  1. Mucopurulent drainage is noted along the right sinus openings with bilateral inferior turbinate hypertrophy. No polyps or other suspicious mass or lesion is noted on today's nasal endoscopy. 2. Sinus CT shows opacification of the right frontal, ethmoid, and maxillary sinuses.   Plan  1. The physical exam, CT, and nasal endoscopy findings are reviewed with the patient.  2. Clindamycin 300 mg qid for 10 days for his acute infection.  3. The patient will benefit from endoscopic sinus surgery of the right frontal, ethmoid and maxillary sinus with bilateral inferior turbinate reduction. The risks, benefits, alternatives, and details of the procedure are extensively reviewed with the patient. Questions are invited and answered. 4. The patient is interested in proceeding with the procedure.  We will schedule the procedure in accordance with the family schedule.

## 2020-09-03 NOTE — Op Note (Signed)
DATE OF PROCEDURE: 09/03/2020  OPERATIVE REPORT   SURGEON: Newman Pies, MD   PREOPERATIVE DIAGNOSES:  1. Chronic right frontal, maxillary, and ethmoid sinusitis. 2. Bilateral inferior turbinate hypertrophy.  3. Chronic nasal obstruction.  POSTOPERATIVE DIAGNOSES:  1. Chronic right frontal, maxillary, and ethmoid sinusitis. 2. Bilateral inferior turbinate hypertrophy.  3. Chronic nasal obstruction.  PROCEDURE PERFORMED:  1. Right endoscopic frontal sinusotomy and total ethmoidectomy with polyp removal. 2. Bilateral partial inferior turbinate resection.  3. Right endoscopic maxillary antrostomy with polyp removal. 4. FUSION stereotactic image guidance.  ANESTHESIA: General endotracheal tube anesthesia.   COMPLICATIONS: None.   ESTIMATED BLOOD LOSS: 150 mL.   INDICATION FOR PROCEDURE: Joshua Ho is a 40 y.o. male with a history of chronic nasal obstruction and recurrent sinus infections.  The patient noted onset of symptoms in March. He noted right sided facial pressure with congestion and purulent drainage. He noted a constant bad taste and smell coming from his nose. The patient has been on 4 antibiotics and several prednisone tapers with no relief. He underwent a sinus CT scan, which showed opacification of the right frontal, ethmoid, and maxillary sinuses. Bilateral inferior turbinate hypertrophy was also noted. On examination, the patient was noted to have mucopurulent drainage from the right sinus openings with bilateral inferior turbinate hypertrophy. Based on the above findings, the decision was made for the patient to undergo the above-stated procedures. The risks, benefits, alternatives, and details of the procedures were discussed with the patient. Questions were invited and answered. Informed consent was obtained.   DESCRIPTION OF PROCEDURE: The patient was taken to the operating room and placed supine on the operating table. General endotracheal tube anesthesia was administered  by the anesthesiologist. The patient was positioned, and prepped and draped in the standard fashion for nasal surgery. Pledgets soaked with Afrin were placed in both nasal cavities for decongestion. The pledgets were subsequently removed. The FUSION stereotactic image guidance marker was placed. The image guidance system was functional throughout the case.  The inferior one half of both hypertrophied inferior turbinate was crossclamped with a Kelly clamp. The inferior one half of each inferior turbinate was then resected with a pair of cross cutting scissors. Hemostasis was achieved with a suction cautery device.   Using a 0 endoscope, the right nasal cavity was examined. A large middle turbinate was noted. Using Tru-Cut forceps, the inferior one third of the middle turbinate was resected. Polypoid tissue was noted within the middle meatus. The polypoid tissue was removed using a combination of microdebrider and Blakesley forceps. The uncinate process was resected with a freer elevator. The maxillary antrum was entered and enlarged using a combination of backbiter and microdebrider. Purulent drainage and polypoid tissue was removed from the right maxillary sinus.  Attention was then focused on the ethmoid sinuses. The bony partitions of the anterior and posterior ethmoid cavities were taken down. Polypoid tissue was noted and removed. Attention was then focused on the frontal sinus. The frontal recess was identified and enlarged by removing the surrounding bony partitions. Polypoid tissue was removed from the frontal recess. Thesinuses were copiously irrigated with saline solution.  The care of the patient was turned over to the anesthesiologist. The patient was awakened from anesthesia without difficulty. The patient was extubated and transferred to the recovery room in good condition.   OPERATIVE FINDINGS: Bilateral inferior turbinate hypertrophy. Chronic right frontal, maxillary, and ethmoid sinusitis  and polyposis.  SPECIMEN: Right sinus contents.  FOLLOWUP CARE: The patient be discharged home  once he is awake and alert. The patient will be placed on Percocet p.r.n. pain, and levofloxacin for 5 days. The patient will follow up in my office in 7 days.   Joshua Darling Philomena Doheny, MD

## 2020-09-03 NOTE — Discharge Instructions (Addendum)
Post Anesthesia Home Care Instructions  Activity: Get plenty of rest for the remainder of the day. A responsible individual must stay with you for 24 hours following the procedure.  For the next 24 hours, DO NOT: -Drive a car -Operate machinery -Drink alcoholic beverages -Take any medication unless instructed by your physician -Make any legal decisions or sign important papers.  Meals: Start with liquid foods such as gelatin or soup. Progress to regular foods as tolerated. Avoid greasy, spicy, heavy foods. If nausea and/or vomiting occur, drink only clear liquids until the nausea and/or vomiting subsides. Call your physician if vomiting continues.  Special Instructions/Symptoms: Your throat may feel dry or sore from the anesthesia or the breathing tube placed in your throat during surgery. If this causes discomfort, gargle with warm salt water. The discomfort should disappear within 24 hours.  If you had a scopolamine patch placed behind your ear for the management of post- operative nausea and/or vomiting:  1. The medication in the patch is effective for 72 hours, after which it should be removed.  Wrap patch in a tissue and discard in the trash. Wash hands thoroughly with soap and water. 2. You may remove the patch earlier than 72 hours if you experience unpleasant side effects which may include dry mouth, dizziness or visual disturbances. 3. Avoid touching the patch. Wash your hands with soap and water after contact with the patch.     ----------------  POSTOPERATIVE INSTRUCTIONS FOR PATIENTS HAVING NASAL OR SINUS OPERATIONS ACTIVITY: Restrict activity at home for the first two days, resting as much as possible. Light activity is best. You may usually return to work within a week. You should refrain from nose blowing, strenuous activity, or heavy lifting greater than 20lbs for a total of one week after your operation.  If sneezing cannot be avoided, sneeze with your mouth  open. DISCOMFORT: You may experience a dull headache and pressure along with nasal congestion and discharge. These symptoms may be worse during the first week after the operation but may last as long as two to four weeks.  Please take Tylenol or the pain medication that has been prescribed for you. Do not take aspirin or aspirin containing medications since they may cause bleeding.  You may experience symptoms of post nasal drainage, nasal congestion, headaches and fatigue for two or three months after your operation.  BLEEDING: You may have some blood tinged nasal drainage for approximately two weeks after the operation.  The discharge will be worse for the first week.  Please call our office at [phone] or go to the nearest hospital emergency room if you experience any of the following: heavy, bright red blood from your nose or mouth that lasts longer than 15 minutes or coughing up or vomiting bright red blood or blood clots. GENERAL CONSIDERATIONS: A gauze dressing will be placed on your upper lip to absorb any drainage after the operation. You may need to change this several times a day.  If you do not have very much drainage, you may remove the dressing.  Remember that you may gently wipe your nose with a tissue and sniff in, but DO NOT blow your nose. Please keep all of your postoperative appointments.  Your final results after the operation will depend on proper follow-up.  The initial visit is usually 2 to 5 days after the operation.  During this visit, the remaining nasal packing and internal septal splints will be removed.  Your nasal and sinus cavities will be   cleaned.  During the second visit, your nasal and sinus cavities will be cleaned again. Have someone drive you to your first two postoperative appointments.  How you care for your nose after the operation will influence the results that you obtain.  You should follow all directions, take your medication as prescribed, and call our office  [phone] with any problems or questions. You may be more comfortable sleeping with your head elevated on two pillows. Do not take any medications that we have not prescribed or recommended. WARNING SIGNS: if any of the following should occur, please call our office: Persistent fever greater than 102F. Persistent vomiting. Severe and constant pain that is not relieved by prescribed pain medication. Trauma to the nose. Rash or unusual side effects from any medicines.  

## 2020-09-03 NOTE — Anesthesia Postprocedure Evaluation (Signed)
Anesthesia Post Note  Patient: Joshua Ho  Procedure(s) Performed: SINUS ENDO WITH FUSION (Bilateral: Nose) TURBINATE REDUCTION (Bilateral: Nose) ENDOSCOPIC SINUS SURGERY RIGHT MAXILLARY ANTROSTOMY WITH TISSUE REMOVAL (Right: Nose) RIGHT ETHMOIDECTOMY AND RIGHT FRONTAL RECESS (Right: Nose)     Patient location during evaluation: PACU Anesthesia Type: General Level of consciousness: awake and alert Pain management: pain level controlled Vital Signs Assessment: post-procedure vital signs reviewed and stable Respiratory status: spontaneous breathing, nonlabored ventilation and respiratory function stable Cardiovascular status: blood pressure returned to baseline and stable Postop Assessment: no apparent nausea or vomiting Anesthetic complications: no   No notable events documented.  Last Vitals:  Vitals:   09/03/20 1116 09/03/20 1128  BP: (!) 143/89 (!) 142/76  Pulse: 88 84  Resp: 14 14  Temp:  37.4 C  SpO2: 96% 97%    Last Pain:  Vitals:   09/03/20 1128  TempSrc: Oral  PainSc: 3                  Lowella Curb

## 2020-09-03 NOTE — Transfer of Care (Signed)
Immediate Anesthesia Transfer of Care Note  Patient: Joshua Ho  Procedure(s) Performed: SINUS ENDO WITH FUSION (Bilateral: Nose) TURBINATE REDUCTION (Bilateral: Nose) ENDOSCOPIC SINUS SURGERY RIGHT MAXILLARY ANTROSTOMY WITH TISSUE REMOVAL (Right: Nose) RIGHT ETHMOIDECTOMY AND RIGHT FRONTAL RECESS (Right: Nose)  Patient Location: PACU  Anesthesia Type:General  Level of Consciousness: awake, alert  and oriented  Airway & Oxygen Therapy: Patient Spontanous Breathing and Patient connected to face mask oxygen  Post-op Assessment: Report given to RN and Post -op Vital signs reviewed and stable  Post vital signs: Reviewed and stable  Last Vitals:  Vitals Value Taken Time  BP 135/84 09/03/20 1035  Temp    Pulse 112 09/03/20 1037  Resp 11 09/03/20 1037  SpO2 100 % 09/03/20 1037  Vitals shown include unvalidated device data.  Last Pain:  Vitals:   09/03/20 0753  TempSrc: Oral  PainSc: 0-No pain      Patients Stated Pain Goal: 3 (09/03/20 0753)  Complications: No notable events documented.

## 2020-09-03 NOTE — Anesthesia Procedure Notes (Signed)
Procedure Name: Intubation Date/Time: 09/03/2020 9:07 AM Performed by: Maryella Shivers, CRNA Pre-anesthesia Checklist: Patient identified, Emergency Drugs available, Suction available and Patient being monitored Patient Re-evaluated:Patient Re-evaluated prior to induction Oxygen Delivery Method: Circle system utilized Preoxygenation: Pre-oxygenation with 100% oxygen Induction Type: IV induction Ventilation: Mask ventilation without difficulty Laryngoscope Size: Mac and 4 Grade View: Grade I Tube type: Oral Rae Tube size: 8.0 mm Number of attempts: 1 Airway Equipment and Method: Stylet and Oral airway Placement Confirmation: ETT inserted through vocal cords under direct vision, positive ETCO2 and breath sounds checked- equal and bilateral Secured at: 22 cm Tube secured with: Tape Dental Injury: Teeth and Oropharynx as per pre-operative assessment

## 2020-09-04 LAB — SURGICAL PATHOLOGY

## 2020-09-05 ENCOUNTER — Encounter (HOSPITAL_BASED_OUTPATIENT_CLINIC_OR_DEPARTMENT_OTHER): Payer: Self-pay | Admitting: Otolaryngology

## 2020-09-25 ENCOUNTER — Other Ambulatory Visit (HOSPITAL_COMMUNITY): Payer: Self-pay

## 2020-09-25 MED ORDER — DOXYCYCLINE HYCLATE 100 MG PO TABS
100.0000 mg | ORAL_TABLET | Freq: Two times a day (BID) | ORAL | 0 refills | Status: DC
  Filled 2020-09-25: qty 40, 20d supply, fill #0

## 2020-10-30 ENCOUNTER — Ambulatory Visit
Admission: EM | Admit: 2020-10-30 | Discharge: 2020-10-30 | Disposition: A | Discharge status: Home / Self Care | Payer: No Typology Code available for payment source | Attending: Family Medicine | Admitting: Family Medicine

## 2020-10-30 ENCOUNTER — Other Ambulatory Visit: Payer: Self-pay

## 2020-10-30 ENCOUNTER — Encounter: Payer: Self-pay | Admitting: Emergency Medicine

## 2020-10-30 DIAGNOSIS — K529 Noninfective gastroenteritis and colitis, unspecified: Secondary | ICD-10-CM

## 2020-10-30 MED ORDER — ONDANSETRON 4 MG PO TBDP
4.0000 mg | ORAL_TABLET | Freq: Once | ORAL | Status: AC
  Administered 2020-10-30: 4 mg via ORAL

## 2020-10-30 MED ORDER — ONDANSETRON 4 MG PO TBDP: 4.0000 mg | ORAL_TABLET | Freq: Three times a day (TID) | ORAL | 0 refills | Status: AC | PRN

## 2020-10-30 NOTE — ED Triage Notes (Signed)
Vomiting and diarrhea since last night.  Fever and body aches today with ABD pain.

## 2020-10-30 NOTE — ED Provider Notes (Signed)
[facility]   [id number] 10/30/20 Arrival Time: 1925  ASSESSMENT & PLAN:  1. Gastroenteritis     Meds ordered this encounter  Medications   ondansetron (ZOFRAN-ODT) disintegrating tablet 4 mg   ondansetron (ZOFRAN-ODT) 4 MG disintegrating tablet    Sig: Take 1 tablet (4 mg total) by mouth every 8 (eight) hours as needed for nausea or vomiting.    Dispense:  15 tablet    Refill:  0   Discussed typical duration of symptoms for suspected GI illness. Will do his best to ensure adequate fluid intake in order to avoid dehydration. Will proceed to the Emergency Department for evaluation if unable to tolerate PO fluids regularly.  Otherwise he will f/u with his PCP or here if not showing improvement over the next 48-72 hours.  Reviewed expectations re: course of current medical issues. Questions answered. Outlined signs and symptoms indicating need for more acute intervention. Patient verbalized understanding. After Visit Summary given.   SUBJECTIVE: History from: patient.  Joshua Ho is a 40 y.o. male who presents with complaint of non-bilious, non-bloody n/v with non-bloody diarrhea. Onset  abrupt; yest evening . Abdominal discomfort: mild to moderate and cramping. Symptoms have slightly improved since beginning. Aggravating factors: attempted PO intake. Alleviating factors: none identified. Associated symptoms: fatigue. Subj fever. Appetite: decreased. PO intake: decreased. Ambulatory without assistance. Urinary symptoms: none. Recent travel or camping: none. OTC treatment: none.  Past Surgical History:  Procedure Laterality Date   ESOPHAGOGASTRODUODENOSCOPY N/A 10/20/2013   Procedure: ESOPHAGOGASTRODUODENOSCOPY (EGD);  Surgeon: Malissa Hippo, MD;  [facility];  Service: Endoscopy;  Laterality: N/A;  855   ETHMOIDECTOMY Right 09/03/2020   Procedure: RIGHT ETHMOIDECTOMY AND RIGHT FRONTAL RECESS;  Surgeon: Newman Pies, MD;  Location: [facility];  Service: ENT;  Laterality: Right;   NASAL SINUS SURGERY Right 09/03/2020   Procedure: ENDOSCOPIC SINUS SURGERY RIGHT MAXILLARY ANTROSTOMY WITH TISSUE REMOVAL;  Surgeon: Newman Pies, MD;  Location: [facility];  Service: ENT;  Laterality: Right;   SINUS ENDO WITH FUSION Bilateral 09/03/2020   Procedure: SINUS ENDO WITH FUSION;  Surgeon: Newman Pies, MD;  Location: [facility];  Service: ENT;  Laterality: Bilateral;   TURBINATE REDUCTION Bilateral 09/03/2020   Procedure: TURBINATE REDUCTION;  Surgeon: Newman Pies, MD;  Location: [facility];  Service: ENT;  Laterality: Bilateral;   WISDOM TOOTH EXTRACTION     x 4    OBJECTIVE:  Vitals:   10/30/20 1929  BP: 140/77  Pulse: 96  Resp: 18  Temp: 99.5 F (37.5 C)  TempSrc: Oral  SpO2: 97%    General appearance: alert; no distress Oropharynx: moist Lungs: clear to auscultation bilaterally; unlabored Abdomen: soft; no guarding Back: no CVA tenderness Extremities: no edema; symmetrical with no gross deformities Skin: warm; dry Neurologic: normal gait Psychological: alert and cooperative; normal mood and affect  Allergies  Allergen Reactions   Clindamycin/Lincomycin    Penicillins Rash                                               Past Medical History:  Diagnosis Date   Allergy    Atopic dermatitis of eyelid    GERD (gastroesophageal reflux disease)    Social History   Socioeconomic History   Marital status: Married    Spouse name: Not on file  Number of children: 2   Years of education: Not on file   Highest education level: Some college, no degree  Occupational History   Not on file  Tobacco Use   Smoking status: Former    Packs/day: 0.75    Years: 6.00    Pack years: 4.50    Types: Cigarettes    Quit date: 07/11/2009    Years since quitting: 11.3   Smokeless tobacco: Former   Tobacco comments:    quit 2011  Vaping Use   Vaping Use: Never used  Substance and Sexual  Activity   Alcohol use: No    Alcohol/week: 0.0 standard drinks   Drug use: Not Currently   Sexual activity: Not on file  Other Topics Concern   Not on file  Social History Narrative   Not on file   Social Determinants of Health   Financial Resource Strain: Low Risk    Difficulty of Paying Living Expenses: Not hard at all  Food Insecurity: No Food Insecurity   Worried About Programme researcher, broadcasting/film/video in the Last Year: Never true   Ran Out of Food in the Last Year: Never true  Transportation Needs: No Transportation Needs   Lack of Transportation (Medical): No   Lack of Transportation (Non-Medical): No  Physical Activity: Sufficiently Active   Days of Exercise per Week: 6 days   Minutes of Exercise per Session: 110 min  Stress: No Stress Concern Present   Feeling of Stress : Only a little  Social Connections: Unknown   Frequency of Communication with Friends and Family: Patient refused   Frequency of Social Gatherings with Friends and Family: Patient refused   Attends Religious Services: More than 4 times per year   Active Member of Clubs or Organizations: Patient refused   Attends Banker Meetings: Not on file   Marital Status: Married  Catering manager Violence: Not on file   Family History  Problem Relation Age of Onset   Hyperlipidemia Father       Mardella Layman, MD 10/30/20 [phone]

## 2020-10-30 NOTE — Discharge Instructions (Signed)
Please do your best to ensure adequate fluid intake in order to avoid dehydration. If you find that you are unable to tolerate drinking fluids regularly please proceed to the Emergency Department for evaluation. ° ° °

## 2021-02-23 ENCOUNTER — Ambulatory Visit
Admission: EM | Admit: 2021-02-23 | Discharge: 2021-02-23 | Disposition: A | Discharge status: Home / Self Care | Payer: No Typology Code available for payment source

## 2021-02-23 ENCOUNTER — Other Ambulatory Visit: Payer: Self-pay

## 2021-02-23 DIAGNOSIS — Z9189 Other specified personal risk factors, not elsewhere classified: Secondary | ICD-10-CM | POA: Diagnosis not present

## 2021-02-23 DIAGNOSIS — R52 Pain, unspecified: Secondary | ICD-10-CM | POA: Diagnosis not present

## 2021-02-23 DIAGNOSIS — B349 Viral infection, unspecified: Secondary | ICD-10-CM | POA: Diagnosis not present

## 2021-02-23 DIAGNOSIS — J3089 Other allergic rhinitis: Secondary | ICD-10-CM

## 2021-02-23 DIAGNOSIS — Z9889 Other specified postprocedural states: Secondary | ICD-10-CM

## 2021-02-23 DIAGNOSIS — R519 Headache, unspecified: Secondary | ICD-10-CM | POA: Diagnosis not present

## 2021-02-23 MED ORDER — LEVOCETIRIZINE DIHYDROCHLORIDE 5 MG PO TABS
5.0000 mg | ORAL_TABLET | Freq: Every evening | ORAL | 0 refills | Status: AC
  Filled 2021-04-16: qty 60, 60d supply, fill #0

## 2021-02-23 MED ORDER — PSEUDOEPHEDRINE HCL 60 MG PO TABS: 60.0000 mg | ORAL_TABLET | Freq: Three times a day (TID) | ORAL | 0 refills | Status: AC | PRN

## 2021-02-23 MED ORDER — OSELTAMIVIR PHOSPHATE 75 MG PO CAPS: 75.0000 mg | ORAL_CAPSULE | Freq: Two times a day (BID) | ORAL | 0 refills | Status: AC

## 2021-02-23 NOTE — ED Triage Notes (Signed)
Pt reports headache, body aches, fever 101.1 F and  chills since last night.

## 2021-02-23 NOTE — ED Provider Notes (Signed)
[facility]   MRN: [id number] DOB: 26-Aug-1980  Subjective:   Joshua Ho is a 41 y.o. male presenting for 1 day history of acute onset moderate to severe body aches, fever, sinus headaches, chills.  Fever was as high as 101.1 F.  Patient would like to get Tamiflu as he feels like this is influenza.  He has previously had this in has done very poorly with it in the past.  He does have a history of allergic rhinitis, has a history of nasal sinus surgery, ethmoidectomy from 09/03/2020.  No cough, chest pain, shortness of breath or wheezing.  No current facility-administered medications for this encounter.  Current Outpatient Medications:    ibuprofen (ADVIL) 200 MG tablet, Take 200 mg by mouth every 6 (six) hours as needed., Disp: , Rfl:    ACETAMINOPHEN PO, Take by mouth., Disp: , Rfl:    clobetasol cream (TEMOVATE) 0.05 %, Apply 1 application topically 2 (two) times daily., Disp: 30 g, Rfl: 1   clobetasol cream (TEMOVATE) 0.05 %, APPLY 1 APPLICATION TOPICALLY 2 (TWO) TIMES DAILY., Disp: 30 g, Rfl: 1   ondansetron (ZOFRAN-ODT) 4 MG disintegrating tablet, Take 1 tablet (4 mg total) by mouth every 8 (eight) hours as needed for nausea or vomiting., Disp: 15 tablet, Rfl: 0   Allergies  Allergen Reactions   Clindamycin/Lincomycin    Penicillins Rash    Past Medical History:  Diagnosis Date   Allergy    Atopic dermatitis of eyelid    GERD (gastroesophageal reflux disease)      Past Surgical History:  Procedure Laterality Date   ESOPHAGOGASTRODUODENOSCOPY N/A 10/20/2013   Procedure: ESOPHAGOGASTRODUODENOSCOPY (EGD);  Surgeon: Malissa Hippo, MD;  [facility];  Service: Endoscopy;  Laterality: N/A;  855   ETHMOIDECTOMY Right 09/03/2020   Procedure: RIGHT ETHMOIDECTOMY AND RIGHT FRONTAL RECESS;  Surgeon: Newman Pies, MD;  Location: [facility];  Service: ENT;  Laterality: Right;   NASAL SINUS SURGERY Right 09/03/2020   Procedure: ENDOSCOPIC SINUS  SURGERY RIGHT MAXILLARY ANTROSTOMY WITH TISSUE REMOVAL;  Surgeon: Newman Pies, MD;  Location: [facility];  Service: ENT;  Laterality: Right;   SINUS ENDO WITH FUSION Bilateral 09/03/2020   Procedure: SINUS ENDO WITH FUSION;  Surgeon: Newman Pies, MD;  Location: [facility];  Service: ENT;  Laterality: Bilateral;   TURBINATE REDUCTION Bilateral 09/03/2020   Procedure: TURBINATE REDUCTION;  Surgeon: Newman Pies, MD;  Location: [facility];  Service: ENT;  Laterality: Bilateral;   WISDOM TOOTH EXTRACTION     x 4    Family History  Problem Relation Age of Onset   Hyperlipidemia Father     Social History   Tobacco Use   Smoking status: Former    Packs/day: 0.75    Years: 6.00    Pack years: 4.50    Types: Cigarettes    Quit date: 07/11/2009    Years since quitting: 11.6   Smokeless tobacco: Former   Tobacco comments:    quit 2011  Vaping Use   Vaping Use: Never used  Substance Use Topics   Alcohol use: No    Alcohol/week: 0.0 standard drinks   Drug use: Never    ROS   Objective:   Vitals: BP (!) 141/84 (BP Location: Right Arm)    Pulse 76    Temp 98.8 F (37.1 C) (Oral)    Resp 18   Physical Exam Constitutional:      General: He is not  in acute distress.    Appearance: Normal appearance. He is well-developed and normal weight. He is not ill-appearing, toxic-appearing or diaphoretic.  HENT:     Head: Normocephalic and atraumatic.     Right Ear: Tympanic membrane, ear canal and external ear normal. There is no impacted cerumen.     Left Ear: Tympanic membrane, ear canal and external ear normal. There is no impacted cerumen.     Nose: Congestion present. No rhinorrhea.     Mouth/Throat:     Mouth: Mucous membranes are moist.     Pharynx: No oropharyngeal exudate or posterior oropharyngeal erythema.  Eyes:     General: No scleral icterus.       Right eye: No discharge.        Left eye: No discharge.     Extraocular Movements:  Extraocular movements intact.     Conjunctiva/sclera: Conjunctivae normal.  Cardiovascular:     Rate and Rhythm: Normal rate and regular rhythm.     Heart sounds: Normal heart sounds. No murmur heard.   No friction rub. No gallop.  Pulmonary:     Effort: Pulmonary effort is normal. No respiratory distress.     Breath sounds: Normal breath sounds. No stridor. No wheezing, rhonchi or rales.  Musculoskeletal:     Cervical back: Normal range of motion and neck supple. No rigidity. No muscular tenderness.  Neurological:     General: No focal deficit present.     Mental Status: He is alert and oriented to person, place, and time.     Cranial Nerves: No cranial nerve deficit.     Motor: No weakness.     Coordination: Coordination normal.     Gait: Gait normal.  Psychiatric:        Mood and Affect: Mood normal.        Behavior: Behavior normal.        Thought Content: Thought content normal.        Judgment: Judgment normal.     Assessment and Plan :   PDMP not reviewed this encounter.  1. Acute viral syndrome   2. At increased risk of exposure to COVID-19 virus   3. Body aches   4. Sinus headache   5. Allergic rhinitis due to other allergic trigger, unspecified seasonality   6. History of sinus surgery    Will cover for influenza with Tamiflu given fever, symptom set, current incidence in the community.  Use supportive care, rest, fluids, hydration, light meals, schedule Tylenol and ibuprofen.  Testing is pending.  Counseled patient on potential for adverse effects with medications prescribed today, patient verbalized understanding. ER and return-to-clinic precautions discussed, patient verbalized understanding.    Wallis Bamberg, PA-C 02/23/21 1201

## 2021-02-25 LAB — COVID-19, FLU A+B NAA
Influenza A, NAA: NOT DETECTED
Influenza B, NAA: NOT DETECTED
SARS-CoV-2, NAA: NOT DETECTED

## 2021-04-16 ENCOUNTER — Other Ambulatory Visit (HOSPITAL_COMMUNITY): Payer: Self-pay

## 2021-05-02 ENCOUNTER — Other Ambulatory Visit (HOSPITAL_COMMUNITY): Payer: Self-pay

## 2021-05-02 MED ORDER — FLUTICASONE PROPIONATE 50 MCG/ACT NA SUSP
2.0000 | NASAL | 5 refills | Status: AC
  Filled 2021-05-02: qty 16, 30d supply, fill #0

## 2021-06-05 ENCOUNTER — Emergency Department (HOSPITAL_COMMUNITY): Payer: No Typology Code available for payment source

## 2021-06-05 ENCOUNTER — Ambulatory Visit
Admission: EM | Admit: 2021-06-05 | Discharge: 2021-06-05 | Disposition: A | Discharge status: Home / Self Care | Payer: No Typology Code available for payment source | Attending: Emergency Medicine | Admitting: Emergency Medicine

## 2021-06-05 ENCOUNTER — Encounter (HOSPITAL_COMMUNITY): Payer: Self-pay

## 2021-06-05 ENCOUNTER — Emergency Department (HOSPITAL_COMMUNITY)
Admission: EM | Admit: 2021-06-05 | Discharge: 2021-06-05 | Disposition: A | Discharge status: Home / Self Care | Payer: No Typology Code available for payment source | Attending: Emergency Medicine | Admitting: Emergency Medicine

## 2021-06-05 ENCOUNTER — Other Ambulatory Visit: Payer: Self-pay

## 2021-06-05 ENCOUNTER — Encounter: Payer: Self-pay | Admitting: Emergency Medicine

## 2021-06-05 DIAGNOSIS — R110 Nausea: Secondary | ICD-10-CM | POA: Insufficient documentation

## 2021-06-05 DIAGNOSIS — R0789 Other chest pain: Secondary | ICD-10-CM

## 2021-06-05 DIAGNOSIS — R079 Chest pain, unspecified: Secondary | ICD-10-CM | POA: Diagnosis present

## 2021-06-05 LAB — CBC
HCT: 48.2 % (ref 39.0–52.0)
Hemoglobin: 16.2 g/dL (ref 13.0–17.0)
MCH: 28.7 pg (ref 26.0–34.0)
MCHC: 33.6 g/dL (ref 30.0–36.0)
MCV: 85.3 fL (ref 80.0–100.0)
Platelets: 250 K/uL (ref 150–400)
RBC: 5.65 MIL/uL (ref 4.22–5.81)
RDW: 11.9 % (ref 11.5–15.5)
WBC: 6.7 K/uL (ref 4.0–10.5)
nRBC: 0 % (ref 0.0–0.2)

## 2021-06-05 LAB — BASIC METABOLIC PANEL
Anion gap: 6 (ref 5–15)
BUN: 18 mg/dL (ref 6–20)
CO2: 27 mmol/L (ref 22–32)
Calcium: 9.1 mg/dL (ref 8.9–10.3)
Chloride: 105 mmol/L (ref 98–111)
Creatinine, Ser: 1.13 mg/dL (ref 0.61–1.24)
GFR, Estimated: >60 mL/min (ref >60)
Glucose, Bld: 103 mg/dL — ABNORMAL HIGH (ref 70–99)
Potassium: 4.1 mmol/L (ref 3.5–5.1)
Sodium: 138 mmol/L (ref 135–145)

## 2021-06-05 LAB — TROPONIN I (HIGH SENSITIVITY)
Troponin I (High Sensitivity): 3 ng/L
Troponin I (High Sensitivity): 2 ng/L

## 2021-06-05 MED ORDER — PANTOPRAZOLE SODIUM 40 MG PO TBEC: 40.0000 mg | DELAYED_RELEASE_TABLET | Freq: Every day | ORAL | 0 refills | Status: AC

## 2021-06-05 MED ORDER — ASPIRIN 81 MG PO CHEW
324.0000 mg | CHEWABLE_TABLET | Freq: Once | ORAL | Status: AC
  Administered 2021-06-05: 324 mg via ORAL

## 2021-06-05 NOTE — Discharge Instructions (Addendum)
Go to Er for further evaluation. Your ekg is normal in urgent care, however we are unable to r/o heart condition in urgent care.

## 2021-06-05 NOTE — ED Notes (Signed)
Patient is being discharged from the Urgent Care and sent to the Emergency Department via pov . Per jeannette, np, patient is in need of higher level of care due to chest pain complaint and limited resources. Patient is aware and verbalizes understanding of plan of care.  Vitals:   06/05/21 1456  BP: 128/83  Pulse: 70  Resp: 18  Temp: 98.3 F (36.8 C)  SpO2: 97%

## 2021-06-05 NOTE — ED Triage Notes (Addendum)
Today around noon noticed a "spasm"  "not a bad pain, just kind of weird."  Feeling is in center of body, cannot say if it is in front or back.    Unable to relate to any activity. History of reflux, not taking any medication.  Reports eating egg whites and yogurt today

## 2021-06-05 NOTE — ED Notes (Signed)
Patient verbalizes understanding of discharge instructions. Opportunity for questioning and answers were provided. Armband removed by staff, pt discharged from ED to home via POV  

## 2021-06-05 NOTE — ED Triage Notes (Signed)
Patient reports chest pain that started around 1030 this am.  Reports UC gave 324mg  ASA.  Denies SOB n/v and was unable to tell if pain was in the front or in the back.

## 2021-06-05 NOTE — ED Provider Triage Note (Signed)
Emergency Medicine Provider Triage Evaluation Note  Joshua Ho , a 41 y.o. male  was evaluated in triage.  Pt complains of chest pain.  Patient states he had episode of chest pain that he rated 4 out of 10 today while sitting in truck.  Patient denies radiation of the pain.  Patient denies shortness of breath.  Patient denies history of cardiac events in the past.  Review of Systems  Positive:  Negative:   Physical Exam  BP (!) 153/92 (BP Location: Right Arm)   Pulse 73   Temp 98.7 F (37.1 C) (Oral)   Resp 14   Ht 5\' 10"  (1.778 m)   Wt 74.4 kg   SpO2 99%   BMI 23.53 kg/m  Gen:   Awake, no distress   Resp:  Normal effort  MSK:   Moves extremities without difficulty  Other:  Lung sounds clear.  No lower extremity swelling.  Medical Decision Making  Medically screening exam initiated at 4:49 PM.  Appropriate orders placed.  Joshua Ho was informed that the remainder of the evaluation will be completed by another provider, this initial triage assessment does not replace that evaluation, and the importance of remaining in the ED until their evaluation is complete.     Joshua Cecil, PA-C 06/05/21 1650

## 2021-06-05 NOTE — ED Provider Notes (Signed)
[facility]    CSN: [id number] Arrival date & time: 06/05/21  1438      History   Chief Complaint Chief Complaint  Patient presents with   Chest Injury    Chest pain and feels like spasms - Entered by patient    HPI Joshua Ho is a 41 y.o. male.   Pt c/o generalized chest spasm intermittent since 1030 am. Pt has hx of GERD. Pt denies smoking,drinking,or drug use. Pt denies lifting or moving anything recently, states he was sitting in his truck doing paperwork. Pt denies nausea,vomiting, diaphoresis, palpitations,SOB or DOE.   The history is provided by the patient. No language interpreter was used.   Past Medical History:  Diagnosis Date   Allergy    Atopic dermatitis of eyelid    GERD (gastroesophageal reflux disease)     Patient Active Problem List   Diagnosis Date Noted   Atypical chest pain 06/05/2021   Gastritis 09/20/2013   Dizziness and giddiness 09/20/2013   Allergic dermatitis 07/10/2013   GERD (gastroesophageal reflux disease) 04/26/2013   Routine general medical examination at a health care facility 07/26/2011    Past Surgical History:  Procedure Laterality Date   ESOPHAGOGASTRODUODENOSCOPY N/A 10/20/2013   Procedure: ESOPHAGOGASTRODUODENOSCOPY (EGD);  Surgeon: Malissa Hippo, MD;  [facility];  Service: Endoscopy;  Laterality: N/A;  855   ETHMOIDECTOMY Right 09/03/2020   Procedure: RIGHT ETHMOIDECTOMY AND RIGHT FRONTAL RECESS;  Surgeon: Newman Pies, MD;  Location: [facility];  Service: ENT;  Laterality: Right;   NASAL SINUS SURGERY Right 09/03/2020   Procedure: ENDOSCOPIC SINUS SURGERY RIGHT MAXILLARY ANTROSTOMY WITH TISSUE REMOVAL;  Surgeon: Newman Pies, MD;  Location: [facility];  Service: ENT;  Laterality: Right;   SINUS ENDO WITH FUSION Bilateral 09/03/2020   Procedure: SINUS ENDO WITH FUSION;  Surgeon: Newman Pies, MD;  Location: [facility];  Service: ENT;  Laterality: Bilateral;    TURBINATE REDUCTION Bilateral 09/03/2020   Procedure: TURBINATE REDUCTION;  Surgeon: Newman Pies, MD;  Location: [facility];  Service: ENT;  Laterality: Bilateral;   WISDOM TOOTH EXTRACTION     x 4       Home Medications    Prior to Admission medications   Medication Sig Start Date End Date Taking? Authorizing Provider  ACETAMINOPHEN PO Take by mouth.    [provider]  clobetasol cream (TEMOVATE) 0.05 % Apply 1 application topically 2 (two) times daily. 02/28/20   Salley Scarlet, MD  fluticasone [facility] ALLERGY RELIEF) 50 MCG/ACT nasal spray Place 2 sprays into both nostrils daily. 05/02/21     ibuprofen (ADVIL) 200 MG tablet Take 200 mg by mouth every 6 (six) hours as needed.    [provider]  levocetirizine (XYZAL) 5 MG tablet Take 1 tablet (5 mg total) by mouth every evening. Patient not taking: Reported on 06/05/2021 02/23/21   Wallis Bamberg, PA-C  ondansetron (ZOFRAN-ODT) 4 MG disintegrating tablet Take 1 tablet (4 mg total) by mouth every 8 (eight) hours as needed for nausea or vomiting. 10/30/20   Mardella Layman, MD  oseltamivir (TAMIFLU) 75 MG capsule Take 1 capsule (75 mg total) by mouth 2 (two) times daily. Patient not taking: Reported on 06/05/2021 02/23/21   Wallis Bamberg, PA-C  pseudoephedrine (SUDAFED) 60 MG tablet Take 1 tablet (60 mg total) by mouth every 8 (eight) hours as needed for congestion. Patient not taking: Reported on 06/05/2021 02/23/21   Wallis Bamberg, PA-C  Family History Family History  Problem Relation Age of Onset   Hyperlipidemia Father     Social History Social History   Tobacco Use   Smoking status: Former    Packs/day: 0.75    Years: 6.00    Pack years: 4.50    Types: Cigarettes    Quit date: 07/11/2009    Years since quitting: 11.9   Smokeless tobacco: Former   Tobacco comments:    quit 2011  Vaping Use   Vaping Use: Never used  Substance Use Topics   Alcohol use: No    Alcohol/week: 0.0 standard drinks   Drug  use: Never     Allergies   Clindamycin/lincomycin and Penicillins   Review of Systems Review of Systems  Constitutional:  Negative for fever.  Respiratory:  Negative for cough and shortness of breath.   Cardiovascular:  Negative for palpitations and leg swelling.       "Chest spasm", pt denies pain  Gastrointestinal:  Negative for nausea and vomiting.  All other systems reviewed and are negative.   Physical Exam Triage Vital Signs ED Triage Vitals  Enc Vitals Group     BP      Pulse      Resp      Temp      Temp src      SpO2      Weight      Height      Head Circumference      Peak Flow      Pain Score      Pain Loc      Pain Edu?      Excl. in [place]?    No data found.  Updated Vital Signs BP 128/83 (BP Location: Right Arm)   Pulse 70   Temp 98.3 F (36.8 C) (Oral)   Resp 18   SpO2 97%   Visual Acuity Right Eye Distance:   Left Eye Distance:   Bilateral Distance:    Right Eye Near:   Left Eye Near:    Bilateral Near:     Physical Exam Vitals and nursing note reviewed.  Constitutional:      Appearance: He is well-developed and well-groomed.  HENT:     Head: Normocephalic.  Cardiovascular:     Rate and Rhythm: Normal rate and regular rhythm.     Pulses: Normal pulses.     Heart sounds: Normal heart sounds.  Neurological:     General: No focal deficit present.     Mental Status: He is alert and oriented to person, place, and time.     GCS: GCS eye subscore is 4. GCS verbal subscore is 5. GCS motor subscore is 6.  Psychiatric:        Attention and Perception: Attention normal.        Mood and Affect: Mood is anxious.        Behavior: Behavior is cooperative.     UC Treatments / Results  Labs (all labs ordered are listed, but only abnormal results are displayed) Labs Reviewed - No data to display  EKG   Radiology No results found.  Procedures Procedures (including critical care time)  Medications Ordered in UC Medications  aspirin  chewable tablet 324 mg (324 mg Oral Given 06/05/21 1508)    Initial Impression / Assessment and Plan / UC Course  I have reviewed the triage vital signs and the nursing notes.  Pertinent labs & imaging results that were available during my care of  the patient were reviewed by me and considered in my medical decision making (see chart for details).  Clinical Course as of 06/05/21 1540  Thu Jun 05, 2021  1514 EKG shows NSR rate 67, no ectopy,no STEMI, QTC 407 [JD]    Clinical Course User Index [JD] Samwise Eckardt, NP    DDX: Atypical chest pain, GERD, anxiety, muscle spasm Final Clinical Impressions(s) / UC Diagnoses   Final diagnoses:  Atypical chest pain     Discharge Instructions      Go to Er for further evaluation. Your ekg is normal in urgent care, however we are unable to r/o heart condition in urgent care.     ED Prescriptions   None    PDMP not reviewed this encounter.   Tori Milks, NP [id number]

## 2021-06-05 NOTE — ED Provider Notes (Signed)
[facility] Provider Note   CSN: [id number] Arrival date & time: 06/05/21  1626     History  Chief Complaint  Patient presents with   Chest Pain    Joshua DagoJason Guy Ho is a 41 y.o. male.   Chest Pain  HPI: A 41 year old patient presents for evaluation of chest pain. Initial onset of pain was more than 6 hours ago. The patient's chest pain is sharp and is not worse with exertion. The patient complains of nausea. The patient's chest pain is middle- or left-sided, is not well-localized, is not described as heaviness/pressure/tightness and does not radiate to the arms/jaw/neck. The patient denies diaphoresis. The patient has no history of stroke, has no history of peripheral artery disease, has not smoked in the past 90 days, denies any history of treated diabetes, has no relevant family history of coronary artery disease (first degree relative at less than age 39), is not hypertensive, has no history of hypercholesterolemia and does not have an elevated BMI (>=30).   Home Medications Prior to Admission medications   Medication Sig Start Date End Date Taking? Authorizing Provider  pantoprazole (PROTONIX) 40 MG tablet Take 1 tablet (40 mg total) by mouth daily for 14 days. 06/05/21 06/19/21 Yes Linwood DibblesKnapp, Aileene Lanum, MD  ACETAMINOPHEN PO Take by mouth.    [provider]  clobetasol cream (TEMOVATE) 0.05 % Apply 1 application topically 2 (two) times daily. 02/28/20   Salley Scarleturham, Kawanta F, MD  fluticasone [facility](FLONASE ALLERGY RELIEF) 50 MCG/ACT nasal spray Place 2 sprays into both nostrils daily. 05/02/21     ibuprofen (ADVIL) 200 MG tablet Take 200 mg by mouth every 6 (six) hours as needed.    [provider]  levocetirizine (XYZAL) 5 MG tablet Take 1 tablet (5 mg total) by mouth every evening. Patient not taking: Reported on 06/05/2021 02/23/21   Wallis BambergMani, Mario, PA-C  ondansetron (ZOFRAN-ODT) 4 MG disintegrating tablet Take 1 tablet (4 mg total) by mouth every 8  (eight) hours as needed for nausea or vomiting. 10/30/20   Mardella LaymanHagler, Brian, MD  oseltamivir (TAMIFLU) 75 MG capsule Take 1 capsule (75 mg total) by mouth 2 (two) times daily. Patient not taking: Reported on 06/05/2021 02/23/21   Wallis BambergMani, Mario, PA-C  pseudoephedrine (SUDAFED) 60 MG tablet Take 1 tablet (60 mg total) by mouth every 8 (eight) hours as needed for congestion. Patient not taking: Reported on 06/05/2021 02/23/21   Wallis BambergMani, Mario, PA-C      Allergies    Clindamycin/lincomycin and Penicillins    Review of Systems   Review of Systems  Cardiovascular:  Positive for chest pain.  All other systems reviewed and are negative.  Physical Exam Updated Vital Signs BP 121/86   Pulse 62   Temp 98.7 F (37.1 C) (Oral)   Resp 15   Ht 1.778 m (5\' 10" )   Wt 74.4 kg   SpO2 100%   BMI 23.53 kg/m  Physical Exam Vitals and nursing note reviewed.  Constitutional:      General: He is not in acute distress.    Appearance: He is well-developed.  HENT:     Head: Normocephalic and atraumatic.     Right Ear: External ear normal.     Left Ear: External ear normal.  Eyes:     General: No scleral icterus.       Right eye: No discharge.        Left eye: No discharge.     Conjunctiva/sclera: Conjunctivae normal.  Neck:  Trachea: No tracheal deviation.  Cardiovascular:     Rate and Rhythm: Normal rate and regular rhythm.  Pulmonary:     Effort: Pulmonary effort is normal. No respiratory distress.     Breath sounds: Normal breath sounds. No stridor. No wheezing or rales.  Abdominal:     General: Bowel sounds are normal. There is no distension.     Palpations: Abdomen is soft.     Tenderness: There is no abdominal tenderness. There is no guarding or rebound.  Musculoskeletal:        General: No tenderness or deformity.     Cervical back: Neck supple.  Skin:    General: Skin is warm and dry.     Findings: No rash.  Neurological:     General: No focal deficit present.     Mental Status: He is  alert.     Cranial Nerves: No cranial nerve deficit (no facial droop, extraocular movements intact, no slurred speech).     Sensory: No sensory deficit.     Motor: No abnormal muscle tone or seizure activity.     Coordination: Coordination normal.  Psychiatric:        Mood and Affect: Mood normal.    ED Results / Procedures / Treatments   Labs (all labs ordered are listed, but only abnormal results are displayed) Labs Reviewed  BASIC METABOLIC PANEL - Abnormal; Notable for the following components:      Result Value   Glucose, Bld 103 (*)    All other components within normal limits  CBC  TROPONIN I (HIGH SENSITIVITY)  TROPONIN I (HIGH SENSITIVITY)    EKG None  Radiology DG Chest 2 View  Result Date: 06/05/2021 CLINICAL DATA:  Chest pain EXAM: CHEST - 2 VIEW COMPARISON:  05/31/2020 FINDINGS: Normal heart size, mediastinal contours, and pulmonary vascularity. Lungs clear. No pleural effusion or pneumothorax. Bones unremarkable. IMPRESSION: Normal exam. Electronically Signed   By: Ulyses Southward M.D.   On: 06/05/2021 17:17    Procedures Procedures    Medications Ordered in ED Medications - No data to display  ED Course/ Medical Decision Making/ A&P Clinical Course as of 06/05/21 1958  Thu Jun 05, 2021  1810 DG Chest 2 View Chest x-ray images and radiology report of reviewed.  No acute findings [JK]  1810 Basic metabolic panel(!) Normal [JK]  1811 Troponin I (High Sensitivity) Normal [JK]  1811 CBC Normal [JK]    Clinical Course User Index [JK] Linwood Dibbles, MD   HEAR Score: 0                       Medical Decision Making Problems Addressed: Chest pain, unspecified type: complicated acute illness or injury  Amount and/or Complexity of Data Reviewed Labs: ordered. Decision-making details documented in ED Course. Radiology: ordered and independent interpretation performed. Decision-making details documented in ED Course.  Risk Prescription drug  management.   Patient presented to the ED for evaluation of chest pain.  Patient is overall low risk for acute coronary syndrome.  Heart score is 0.  ED work-up is reassuring.  Serial troponins are normal.  Doubt ACS  Patient does not have any shortness of breath.  No tachycardia.  No PE risk factors.  Doubt pulmonary embolism.  Unclear etiology.  Patient thought it could be indigestion.  We will try a course of antacids.  Discussed outpatient follow-up warning signs and precautions.  Evaluation and diagnostic testing in the emergency department does not suggest an  emergent condition requiring admission or immediate intervention beyond what has been performed at this time.  The patient is safe for discharge and has been instructed to return immediately for worsening symptoms, change in symptoms or any other concerns.         Final Clinical Impression(s) / ED Diagnoses Final diagnoses:  Chest pain, unspecified type    Rx / DC Orders ED Discharge Orders          Ordered    pantoprazole (PROTONIX) 40 MG tablet  Daily        06/05/21 1958              Linwood Dibbles, MD 06/05/21 2000

## 2021-06-05 NOTE — Discharge Instructions (Signed)
Take the medications as prescribed.  Follow-up with your primary care doctor to be rechecked.  Return to the ED for recurrent symptoms

## 2021-09-29 IMAGING — CT CT MAXILLOFACIAL W/ CM
3 of 5 series · 14 of 47 positions shown, 17 images · IV contrast (omnipaque)
Comparison: None.

CLINICAL DATA: Normal x-ray.  Sinusitis.

EXAM:
CT MAXILLOFACIAL WITH CONTRAST
TECHNIQUE: Multidetector CT imaging of the maxillofacial structures was
performed with intravenous contrast. Multiplanar CT image
reconstructions were also generated.
CONTRAST:  75mL OMNIPAQUE IOHEXOL 350 MG/ML SOLN

[Series 2: standard · axial · 0.44mm/px · z∈[-208,-38]mm · 9 of 197 slices shown, 12 images]
[im 14/197  brain]
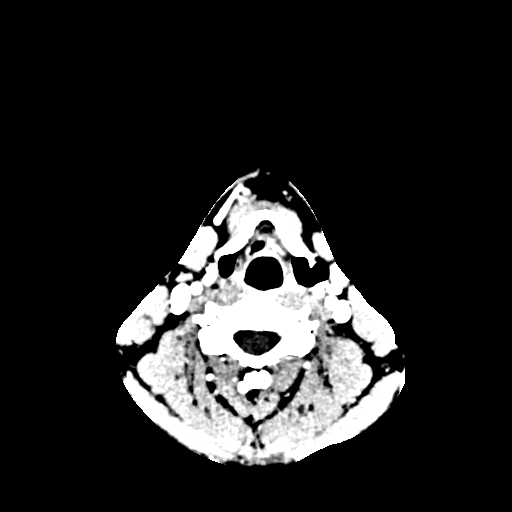
[im 14/197  bone]
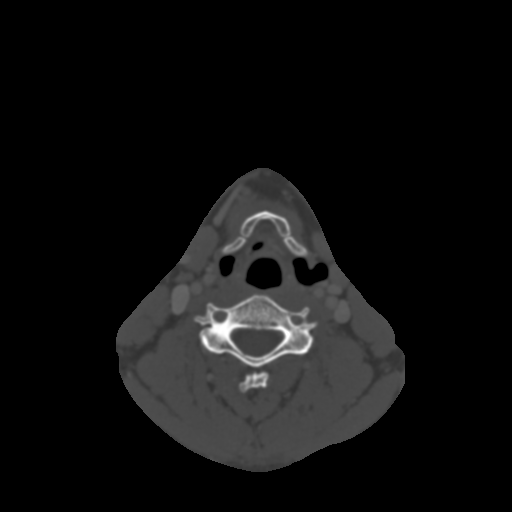
[im 34/197  bone]
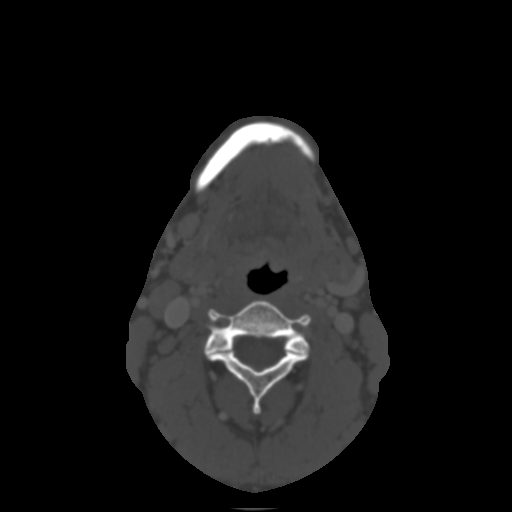
[im 55/197  bone]
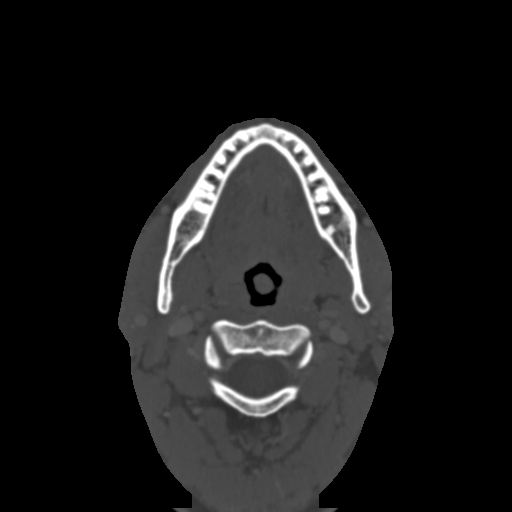
[im 75/197  bone]
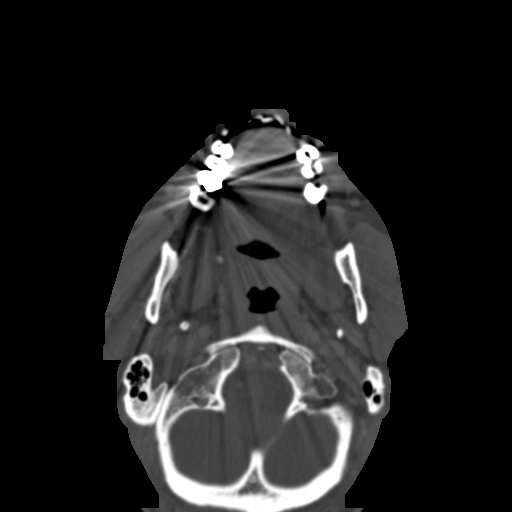
[im 102/197  brain]
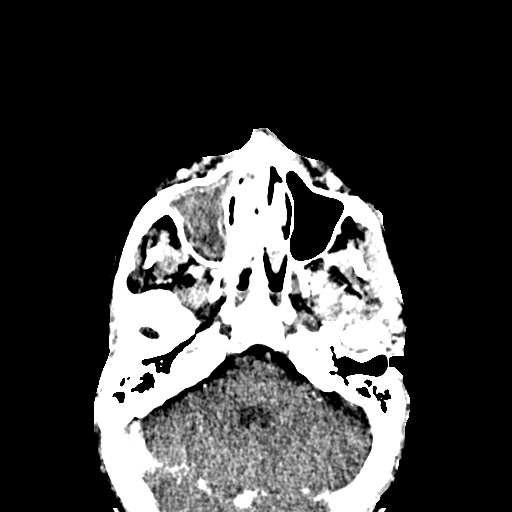
[im 102/197  bone]
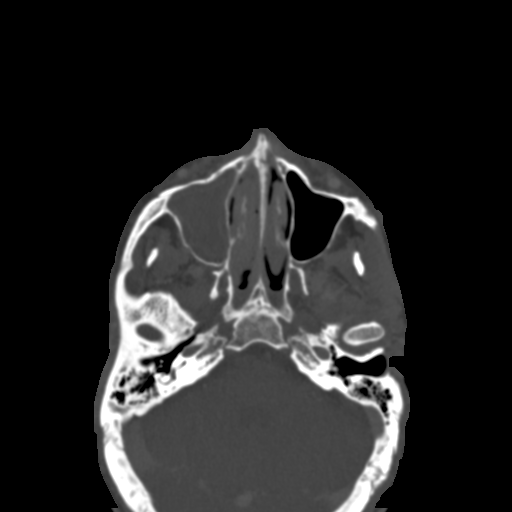
[im 122/197  bone]
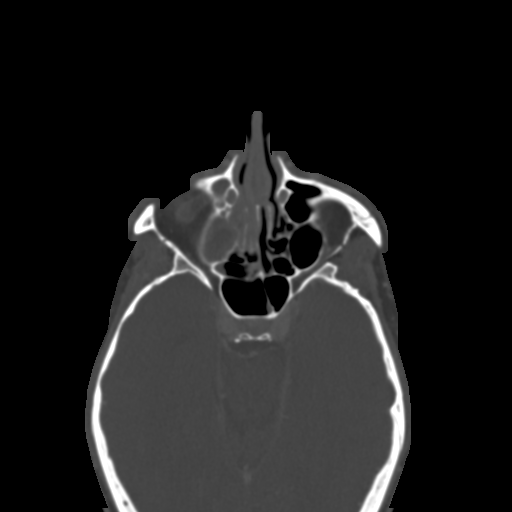
[im 142/197  bone]
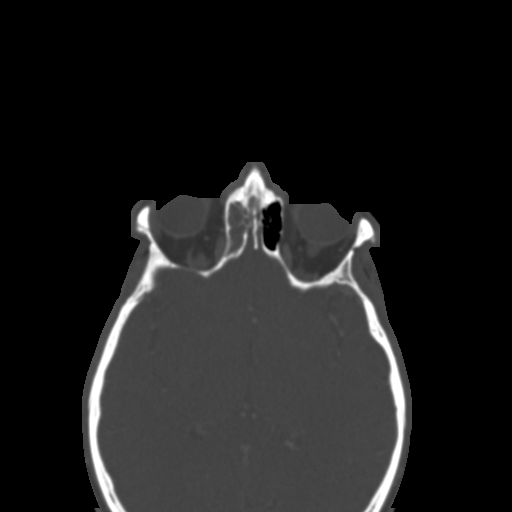
[im 163/197  bone]
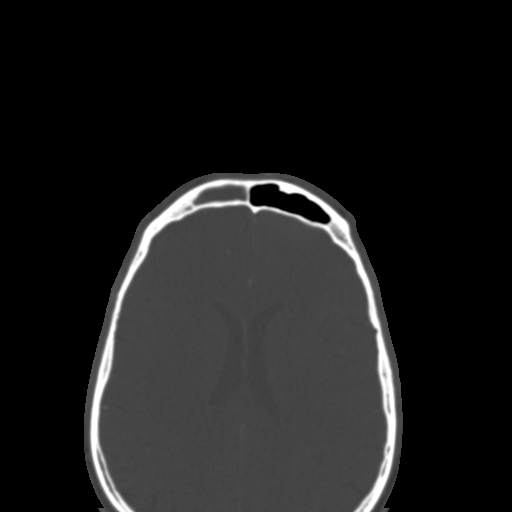
[im 183/197  brain]
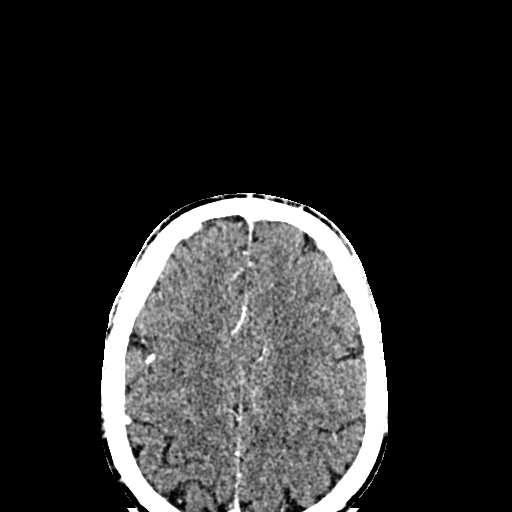
[im 183/197  bone]
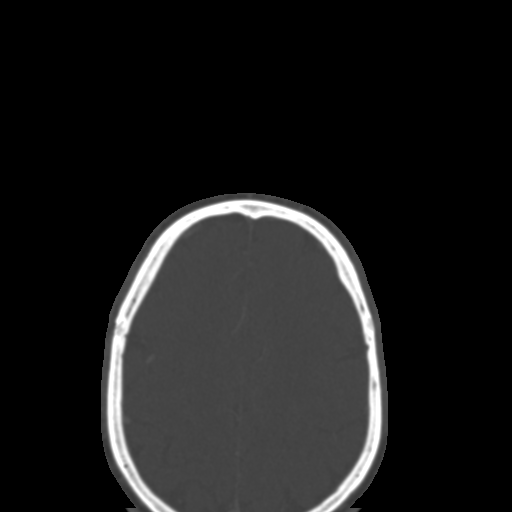

[Series 5: sagittal · sagittal · 0.38mm/px · 2 of 109 slices shown]
[im 37/109  bone]
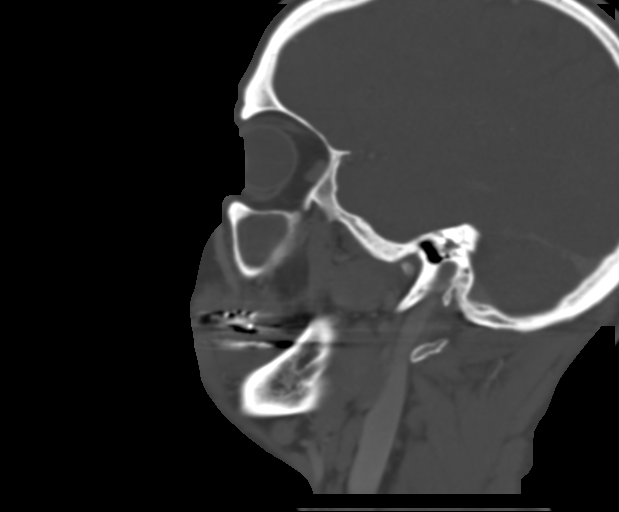
[im 73/109  bone]
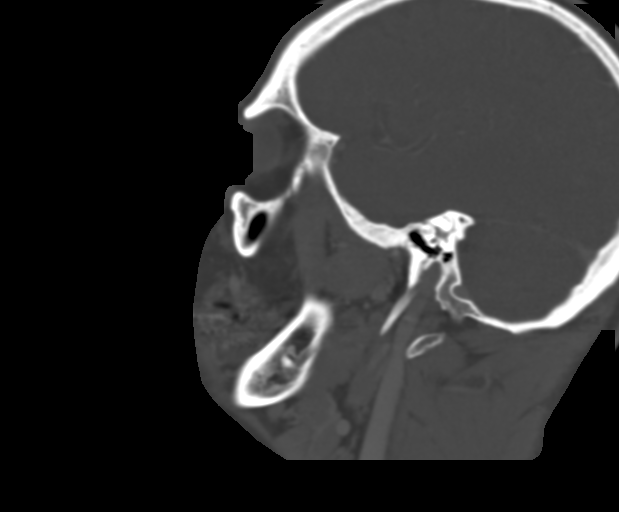

[Series 6: coronal · coronal · 0.38mm/px · 3 of 89 slices shown]
[im 23/89  bone]
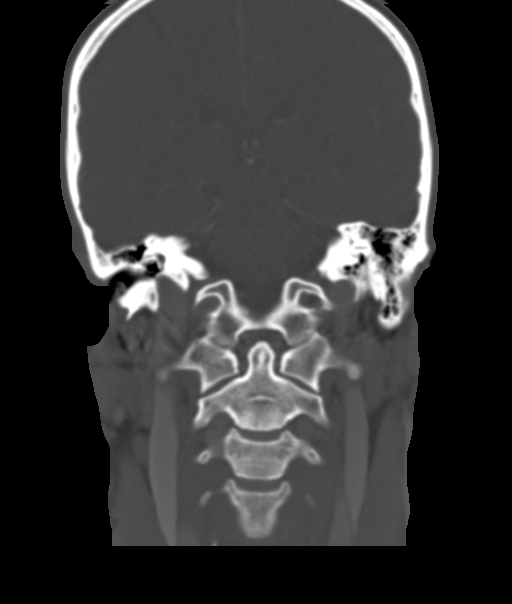
[im 45/89  bone]
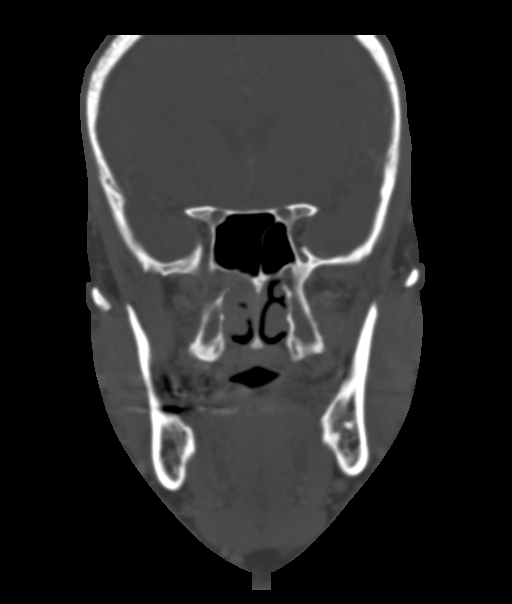
[im 67/89  bone]
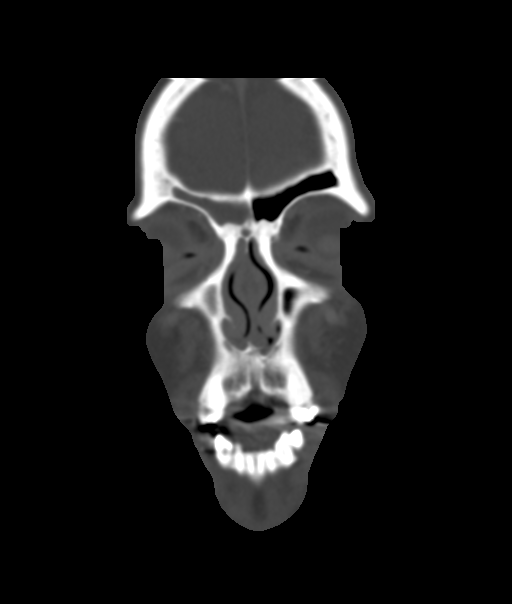

[14 of 47 positions shown; findings below may reference images not displayed]

FINDINGS: Osseous: No fracture or mandibular dislocation. No destructive
process.

Orbits: Negative. No traumatic or inflammatory finding.

Sinuses:

Frontal sinus: Completely opacified right frontal sinus and right
frontoethmoidal recess. The left frontal sinus is normally aerated.

Ethmoid air cells: Complete opacification of the anterior right
ethmoid air cells. Mild mucosal thickening of the posterior right
ethmoid air cells. Left ethmoid air cells are largely clear.

Maxillary sinuses: Complete opacification of the right maxillary
sinus with mucosal thickening. Minimal left inferior maxillary sinus
mucosal thickening with otherwise normally aerated left maxillary
sinus. The right maxillary ostium is opacified and may be widened.

Nasal cavity: Opacified right middle meatus. Additionally, there is
opacification of the right superior meatus and olfactory cleft.

Anatomy: Sellar type sphenoid pneumatization. Keros type II
olfactory grooves bilaterally. There is pneumatization superior to
the anterior ethmoid notches bilaterally.

Soft tissues: Mildly prominent submandibular and upper cervical
chain as, which are nonspecific but potentially reactive.

Limited intracranial: No evidence of obvious acute abnormality on
limited assessment/visualization.
IMPRESSION: 1. Severe right ostiomeatal unit pattern sinusitis, including
complete opacification the right frontal sinus, anterior ethmoid air
cells and right maxillary sinus. There is opacification of the right
maxillary ostium, right middle meatus and superior right nasal
cavity including the olfactory cleft anteriorly. ENT consultation is
recommended to exclude an underlying obstructive mass (nonspecific
by imaging but most commonly polyps).
2. Anatomic considerations are detailed above, including
pneumatization superior to the anterior ethmoid notches bilaterally.
3. Mildly prominent submandibular and upper cervical chain as, which
are nonspecific but potentially reactive. Recommend clinical
follow-up.

## 2021-11-10 ENCOUNTER — Other Ambulatory Visit (HOSPITAL_COMMUNITY): Payer: Self-pay

## 2021-11-10 MED ORDER — DOXYCYCLINE MONOHYDRATE 100 MG PO CAPS
100.0000 mg | ORAL_CAPSULE | Freq: Two times a day (BID) | ORAL | 0 refills | Status: AC
  Filled 2021-11-10: qty 20, 10d supply, fill #0

## 2021-11-15 ENCOUNTER — Encounter (INDEPENDENT_AMBULATORY_CARE_PROVIDER_SITE_OTHER): Payer: Self-pay | Admitting: Gastroenterology

## 2022-02-18 ENCOUNTER — Other Ambulatory Visit (HOSPITAL_COMMUNITY): Payer: Self-pay

## 2022-02-18 MED ORDER — ESCITALOPRAM OXALATE 10 MG PO TABS
10.0000 mg | ORAL_TABLET | Freq: Every day | ORAL | 1 refills | Status: AC
  Filled 2022-02-18: qty 90, 90d supply, fill #0
  Filled 2022-08-13: qty 90, 90d supply, fill #1

## 2022-02-19 ENCOUNTER — Other Ambulatory Visit (HOSPITAL_COMMUNITY): Payer: Self-pay

## 2022-04-28 DIAGNOSIS — Z8639 Personal history of other endocrine, nutritional and metabolic disease: Secondary | ICD-10-CM | POA: Diagnosis not present

## 2022-04-28 DIAGNOSIS — Z0001 Encounter for general adult medical examination with abnormal findings: Secondary | ICD-10-CM | POA: Diagnosis not present

## 2022-05-04 ENCOUNTER — Other Ambulatory Visit (HOSPITAL_COMMUNITY): Payer: Self-pay

## 2022-05-04 MED ORDER — ESCITALOPRAM OXALATE 10 MG PO TABS
10.0000 mg | ORAL_TABLET | Freq: Every day | ORAL | 3 refills | Status: DC
  Filled 2022-05-04 – 2022-05-13 (×2): qty 90, 90d supply, fill #0
  Filled 2022-08-15 – 2022-08-20 (×4): qty 90, 90d supply, fill #1
  Filled 2022-11-13: qty 90, 90d supply, fill #2

## 2022-05-05 ENCOUNTER — Other Ambulatory Visit (HOSPITAL_COMMUNITY): Payer: Self-pay

## 2022-05-13 ENCOUNTER — Other Ambulatory Visit (HOSPITAL_COMMUNITY): Payer: Self-pay

## 2022-07-02 ENCOUNTER — Other Ambulatory Visit (HOSPITAL_COMMUNITY): Payer: Self-pay

## 2022-07-02 DIAGNOSIS — D225 Melanocytic nevi of trunk: Secondary | ICD-10-CM | POA: Diagnosis not present

## 2022-07-02 DIAGNOSIS — D2272 Melanocytic nevi of left lower limb, including hip: Secondary | ICD-10-CM | POA: Diagnosis not present

## 2022-07-02 DIAGNOSIS — D2271 Melanocytic nevi of right lower limb, including hip: Secondary | ICD-10-CM | POA: Diagnosis not present

## 2022-07-02 DIAGNOSIS — D2262 Melanocytic nevi of left upper limb, including shoulder: Secondary | ICD-10-CM | POA: Diagnosis not present

## 2022-07-02 DIAGNOSIS — L814 Other melanin hyperpigmentation: Secondary | ICD-10-CM | POA: Diagnosis not present

## 2022-07-02 DIAGNOSIS — L301 Dyshidrosis [pompholyx]: Secondary | ICD-10-CM | POA: Diagnosis not present

## 2022-07-02 DIAGNOSIS — D2261 Melanocytic nevi of right upper limb, including shoulder: Secondary | ICD-10-CM | POA: Diagnosis not present

## 2022-07-02 DIAGNOSIS — L578 Other skin changes due to chronic exposure to nonionizing radiation: Secondary | ICD-10-CM | POA: Diagnosis not present

## 2022-07-02 MED ORDER — CLOBETASOL PROPIONATE 0.05 % EX CREA
TOPICAL_CREAM | Freq: Two times a day (BID) | CUTANEOUS | 1 refills | Status: AC
  Filled 2022-07-02: qty 30, 30d supply, fill #0

## 2022-07-03 ENCOUNTER — Other Ambulatory Visit (HOSPITAL_COMMUNITY): Payer: Self-pay

## 2022-07-03 MED ORDER — ZOLPIDEM TARTRATE 10 MG PO TABS
10.0000 mg | ORAL_TABLET | Freq: Every day | ORAL | 0 refills | Status: AC
  Filled 2022-07-03: qty 5, 5d supply, fill #0

## 2022-08-10 IMAGING — DX DG CHEST 2V
2 series · 2 of 2 positions shown · non-contrast
Comparison: 05/31/2020

CLINICAL DATA: Chest pain

EXAM:
CHEST - 2 VIEW

[w chest pa]
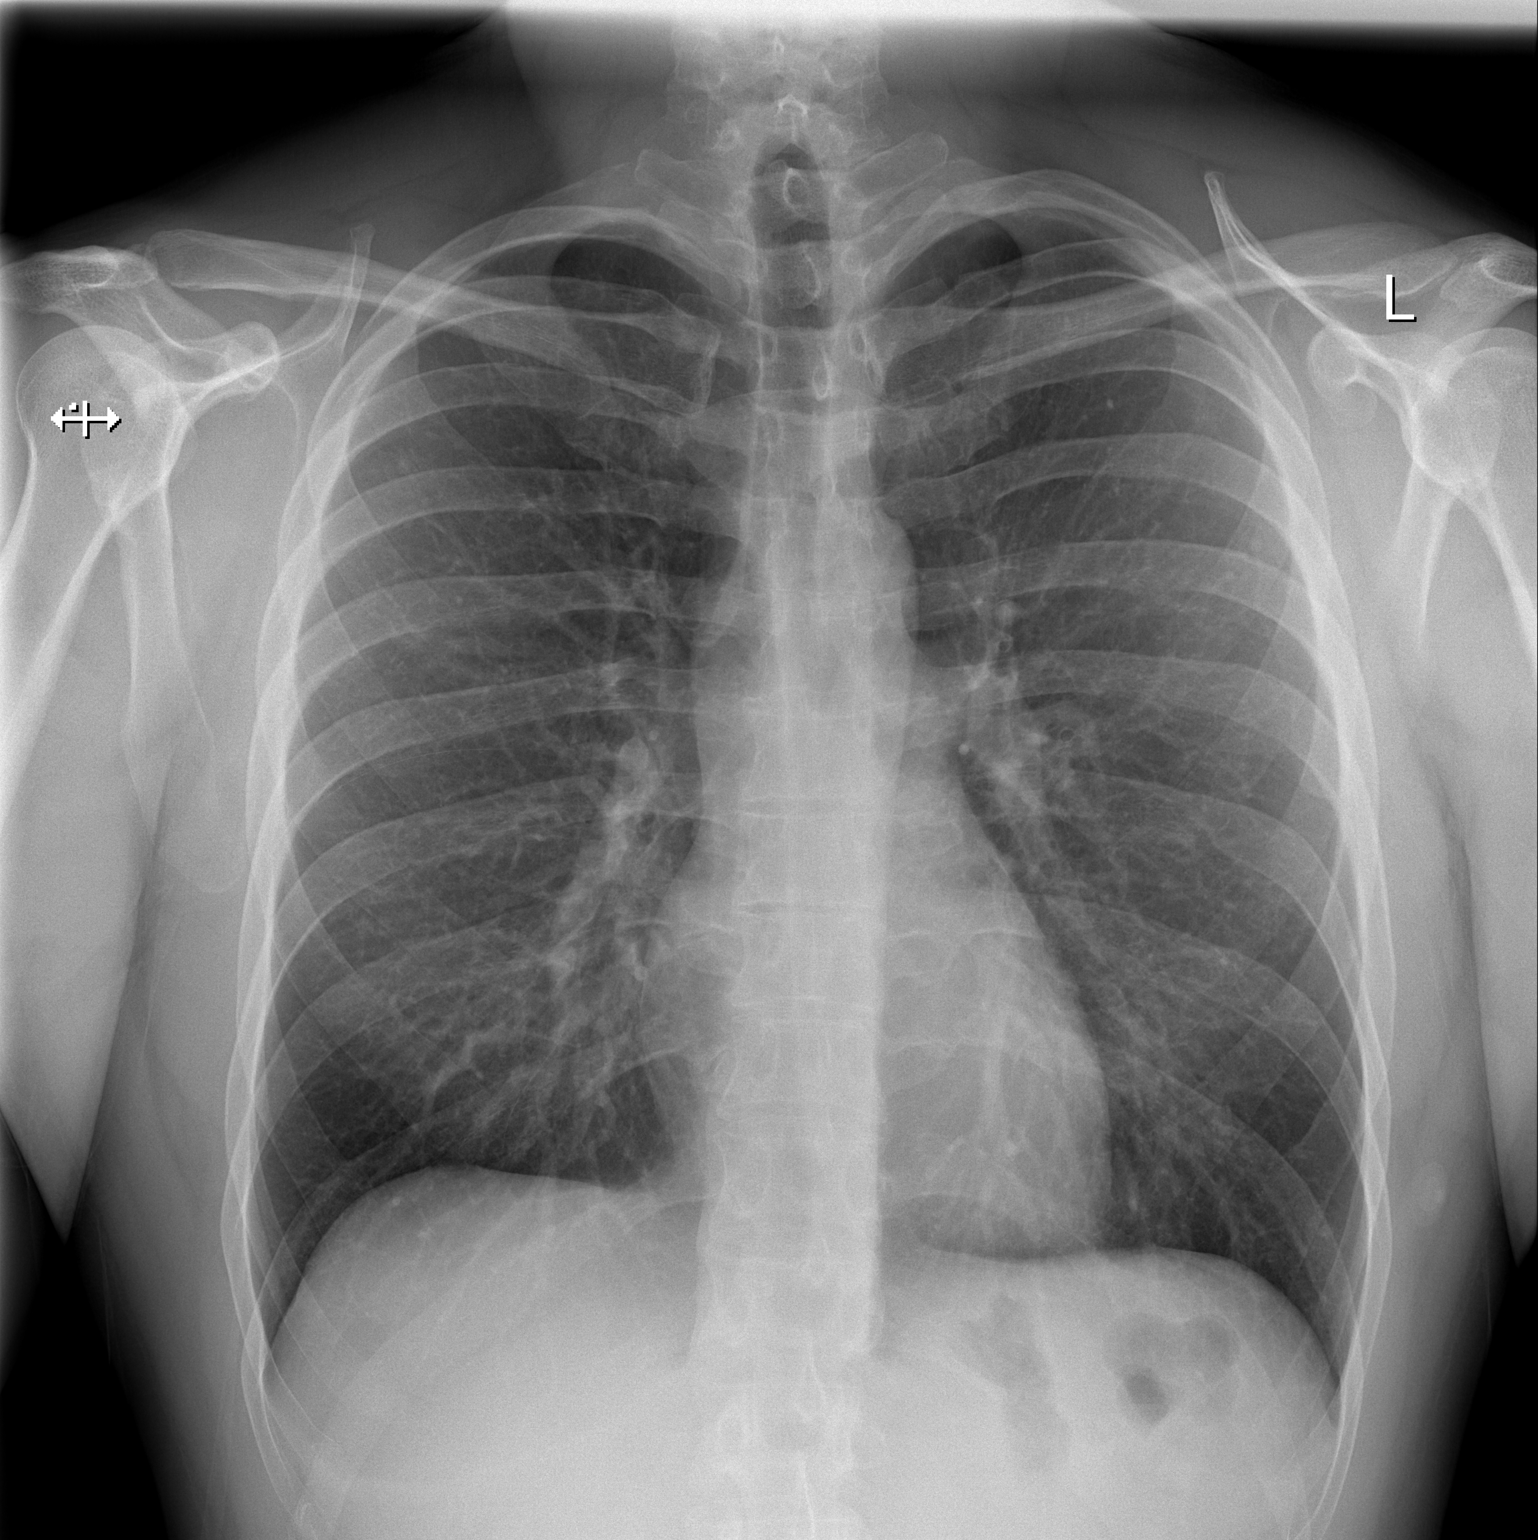

[w chest lat]
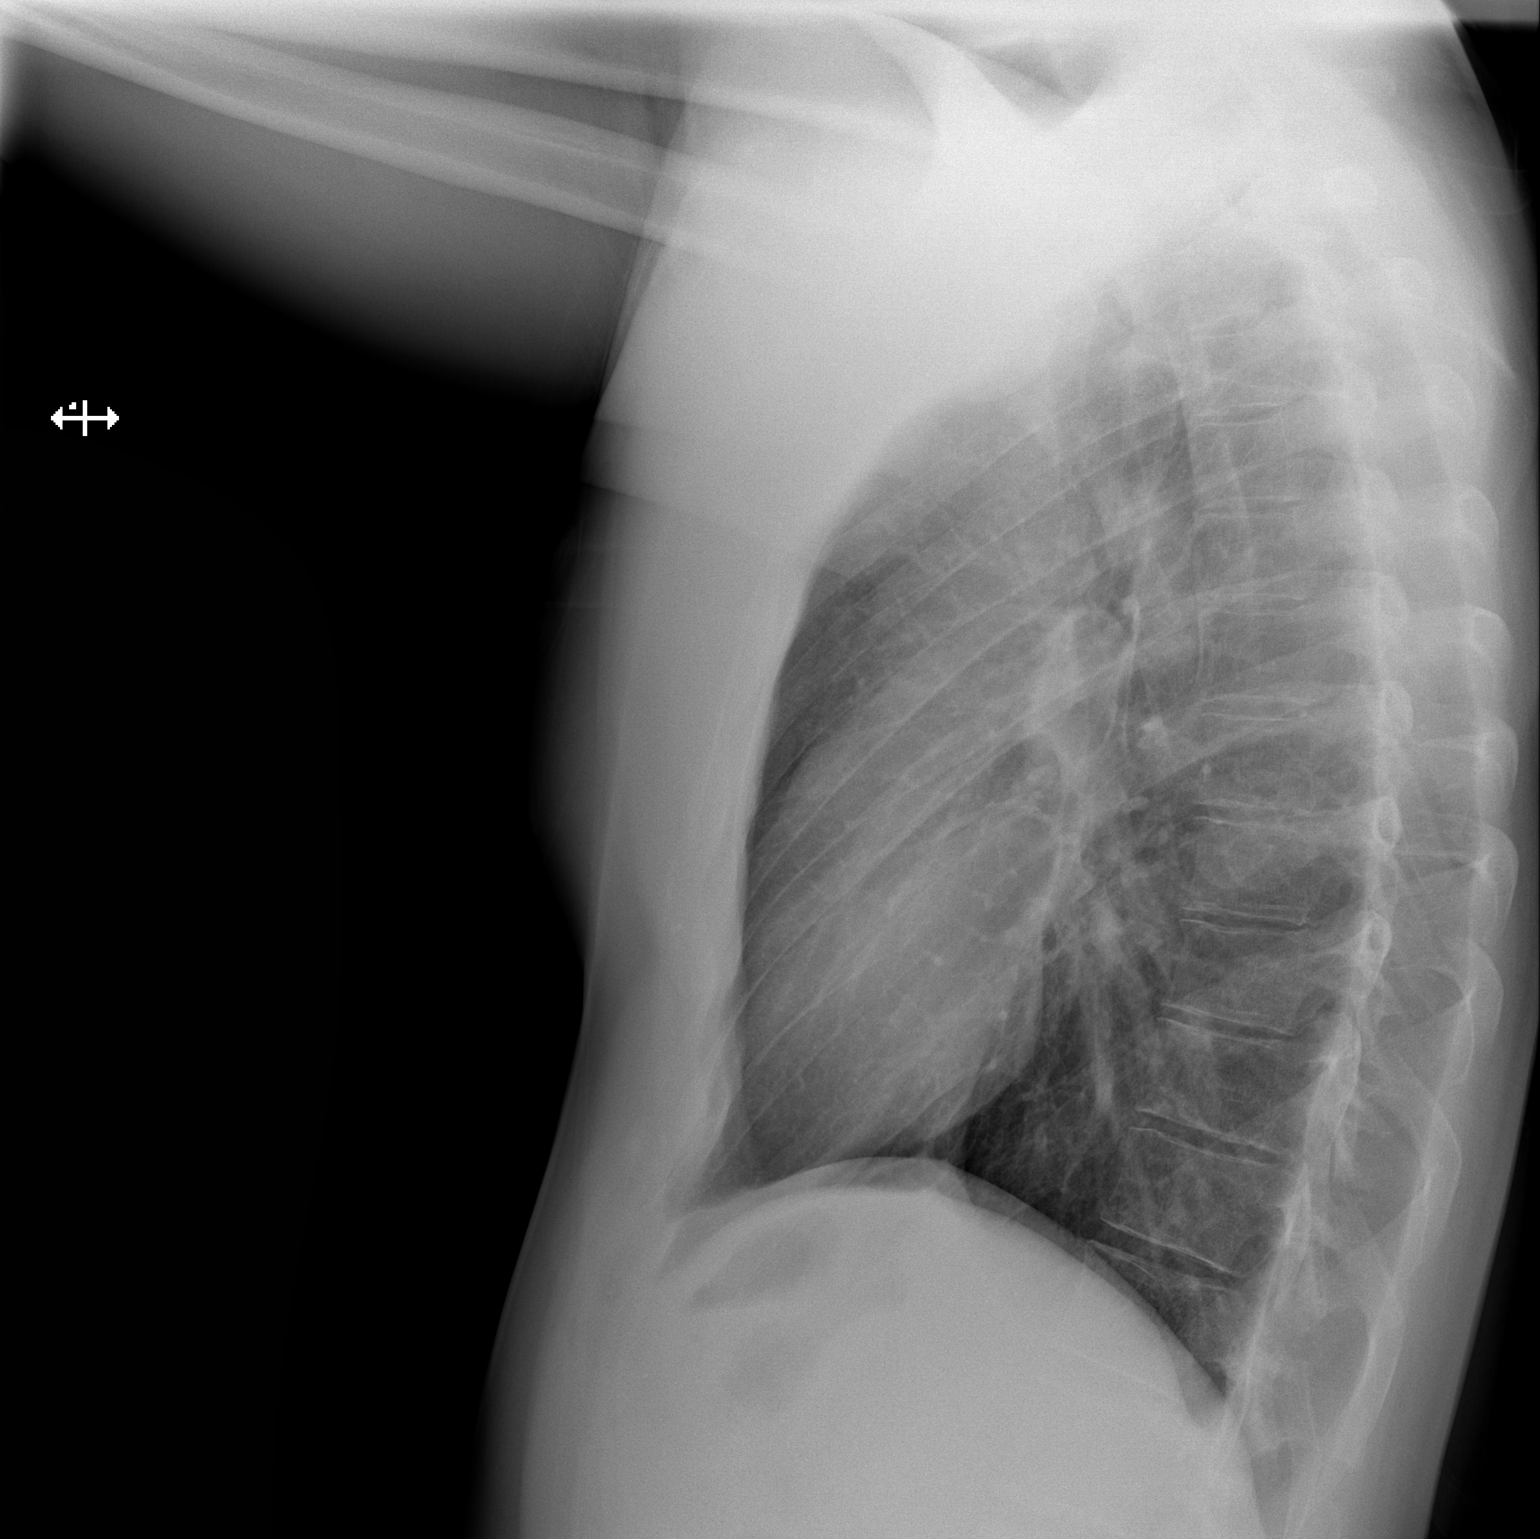

[2 of 2 positions shown; findings below may reference images not displayed]

FINDINGS: Normal heart size, mediastinal contours, and pulmonary vascularity.

Lungs clear.

No pleural effusion or pneumothorax.

Bones unremarkable.
IMPRESSION: Normal exam.

## 2022-08-14 ENCOUNTER — Other Ambulatory Visit (HOSPITAL_COMMUNITY): Payer: Self-pay

## 2022-08-17 ENCOUNTER — Other Ambulatory Visit (HOSPITAL_COMMUNITY): Payer: Self-pay

## 2022-08-17 ENCOUNTER — Encounter: Payer: Self-pay | Admitting: Pharmacist

## 2022-08-17 ENCOUNTER — Other Ambulatory Visit: Payer: Self-pay

## 2022-08-19 ENCOUNTER — Other Ambulatory Visit (HOSPITAL_COMMUNITY): Payer: Self-pay

## 2022-08-20 ENCOUNTER — Other Ambulatory Visit: Payer: Self-pay

## 2022-08-20 ENCOUNTER — Other Ambulatory Visit (HOSPITAL_COMMUNITY): Payer: Self-pay

## 2022-09-01 DIAGNOSIS — Z6825 Body mass index (BMI) 25.0-25.9, adult: Secondary | ICD-10-CM | POA: Diagnosis not present

## 2022-09-01 DIAGNOSIS — F419 Anxiety disorder, unspecified: Secondary | ICD-10-CM | POA: Diagnosis not present

## 2022-09-01 DIAGNOSIS — R030 Elevated blood-pressure reading, without diagnosis of hypertension: Secondary | ICD-10-CM | POA: Diagnosis not present

## 2022-09-01 DIAGNOSIS — G4700 Insomnia, unspecified: Secondary | ICD-10-CM | POA: Diagnosis not present

## 2022-12-04 ENCOUNTER — Ambulatory Visit
Admission: EM | Admit: 2022-12-04 | Discharge: 2022-12-04 | Disposition: A | Discharge status: Home / Self Care | Payer: 59 | Attending: Family Medicine | Admitting: Family Medicine

## 2022-12-04 DIAGNOSIS — R509 Fever, unspecified: Secondary | ICD-10-CM

## 2022-12-04 DIAGNOSIS — J069 Acute upper respiratory infection, unspecified: Secondary | ICD-10-CM

## 2022-12-04 LAB — POC COVID19/FLU A&B COMBO
Covid Antigen, POC: NEGATIVE
Influenza A Antigen, POC: NEGATIVE
Influenza B Antigen, POC: NEGATIVE

## 2022-12-04 MED ORDER — PROMETHAZINE-DM 6.25-15 MG/5ML PO SYRP: 5.0000 mL | ORAL_SOLUTION | Freq: Four times a day (QID) | ORAL | 0 refills | Status: AC | PRN

## 2022-12-04 NOTE — ED Triage Notes (Signed)
Pt wife reports body aches, sore throat, fever, and and cough x 2 days. Fever today      Took mucinex

## 2022-12-04 NOTE — ED Provider Notes (Signed)
[facility]    CSN: [id number] Arrival date & time: 12/04/22  1924      History   Chief Complaint No chief complaint on file.   HPI Joshua Ho is a 42 y.o. male.   Presenting today with 2-day history of congestion, sore throat, fever, body aches, cough and fatigue.  Denies chest pain, shortness of breath, abdominal pain, nausea vomiting or diarrhea.  Taking Mucinex fast max with minimal relief.  Wife and son have been sick with similar symptoms.    Past Medical History:  Diagnosis Date   Allergy    Atopic dermatitis of eyelid    GERD (gastroesophageal reflux disease)     Patient Active Problem List   Diagnosis Date Noted   Atypical chest pain 06/05/2021   Gastritis 09/20/2013   Dizziness and giddiness 09/20/2013   Allergic dermatitis 07/10/2013   GERD (gastroesophageal reflux disease) 04/26/2013   Routine general medical examination at a health care facility 07/26/2011    Past Surgical History:  Procedure Laterality Date   ESOPHAGOGASTRODUODENOSCOPY N/A 10/20/2013   Procedure: ESOPHAGOGASTRODUODENOSCOPY (EGD);  Surgeon: Malissa Hippo, MD;  Location: [facility];  Service: Endoscopy;  Laterality: N/A;  855   ETHMOIDECTOMY Right 09/03/2020   Procedure: RIGHT ETHMOIDECTOMY AND RIGHT FRONTAL RECESS;  Surgeon: Newman Pies, MD;  Location: [facility];  Service: ENT;  Laterality: Right;   NASAL SINUS SURGERY Right 09/03/2020   Procedure: ENDOSCOPIC SINUS SURGERY RIGHT MAXILLARY ANTROSTOMY WITH TISSUE REMOVAL;  Surgeon: Newman Pies, MD;  Location: [facility];  Service: ENT;  Laterality: Right;   SINUS ENDO WITH FUSION Bilateral 09/03/2020   Procedure: SINUS ENDO WITH FUSION;  Surgeon: Newman Pies, MD;  Location: [facility];  Service: ENT;  Laterality: Bilateral;   TURBINATE REDUCTION Bilateral 09/03/2020   Procedure: TURBINATE REDUCTION;  Surgeon: Newman Pies, MD;  Location:  SURGERY CENTER;  Service: ENT;   Laterality: Bilateral;   WISDOM TOOTH EXTRACTION     x 4       Home Medications    Prior to Admission medications   Medication Sig Start Date End Date Taking? Authorizing Provider  promethazine-dextromethorphan (PROMETHAZINE-DM) 6.25-15 MG/5ML syrup Take 5 mLs by mouth 4 (four) times daily as needed. 12/04/22  Yes Particia Nearing, PA-C  ACETAMINOPHEN PO Take by mouth.    [provider]  clobetasol cream (TEMOVATE) 0.05 % Apply 1 application topically 2 (two) times daily. 02/28/20   Salley Scarlet, MD  clobetasol cream (TEMOVATE) 0.05 % Apply a thin layer to affected areas on hands 2 (two) times daily as needed for flares. Do not apply on clear skin 07/02/22     doxycycline (MONODOX) 100 MG capsule Take 1 capsule (100 mg total) by mouth 2 (two) times daily for 10 days. 11/10/21     escitalopram (LEXAPRO) 10 MG tablet Take 1 tablet (10 mg total) by mouth daily. 02/18/22     escitalopram (LEXAPRO) 10 MG tablet Take 1 tablet (10 mg total) by mouth daily. 05/04/22     fluticasone (FLONASE ALLERGY RELIEF) 50 MCG/ACT nasal spray Place 2 sprays into both nostrils daily. 05/02/21     ibuprofen (ADVIL) 200 MG tablet Take 200 mg by mouth every 6 (six) hours as needed.    [provider]  levocetirizine (XYZAL) 5 MG tablet Take 1 tablet (5 mg total) by mouth every evening. Patient not taking: Reported on 06/05/2021 02/23/21   Wallis Bamberg, PA-C  ondansetron (ZOFRAN-ODT) 4  MG disintegrating tablet Take 1 tablet (4 mg total) by mouth every 8 (eight) hours as needed for nausea or vomiting. 10/30/20   Mardella Layman, MD  oseltamivir (TAMIFLU) 75 MG capsule Take 1 capsule (75 mg total) by mouth 2 (two) times daily. Patient not taking: Reported on 06/05/2021 02/23/21   Wallis Bamberg, PA-C  pantoprazole (PROTONIX) 40 MG tablet Take 1 tablet (40 mg total) by mouth daily for 14 days. 06/05/21 06/19/21  Linwood Dibbles, MD  pseudoephedrine (SUDAFED) 60 MG tablet Take 1 tablet (60 mg total) by mouth every 8  (eight) hours as needed for congestion. Patient not taking: Reported on 06/05/2021 02/23/21   Wallis Bamberg, PA-C  zolpidem (AMBIEN) 10 MG tablet Take 1 tablet (10 mg total) by mouth at bedtime. 06/30/22   Stonecipher, Royal Hawthorn, MD    Family History Family History  Problem Relation Age of Onset   Hyperlipidemia Father     Social History Social History   Tobacco Use   Smoking status: Former    Current packs/day: 0.00    Average packs/day: 0.8 packs/day for 6.0 years (4.5 ttl pk-yrs)    Types: Cigarettes    Start date: 07/12/2003    Quit date: 07/11/2009    Years since quitting: 13.4   Smokeless tobacco: Former   Tobacco comments:    quit 2011  Vaping Use   Vaping status: Never Used  Substance Use Topics   Alcohol use: No    Alcohol/week: 0.0 standard drinks of alcohol   Drug use: Never     Allergies   Clindamycin/lincomycin and Penicillins   Review of Systems Review of Systems PER HPI  Physical Exam Triage Vital Signs ED Triage Vitals  Encounter Vitals Group     BP 12/04/22 1928 119/67     Systolic BP Percentile --      Diastolic BP Percentile --      Pulse Rate 12/04/22 1928 99     Resp 12/04/22 1928 20     Temp 12/04/22 1928 (!) 100.7 F (38.2 C)     Temp Source 12/04/22 1928 Oral     SpO2 12/04/22 1928 97 %     Weight --      Height --      Head Circumference --      Peak Flow --      Pain Score 12/04/22 1932 5     Pain Loc --      Pain Education --      Exclude from Growth Chart --    No data found.  Updated Vital Signs BP 119/67 (BP Location: Right Arm)   Pulse 99   Temp (!) 100.7 F (38.2 C) (Oral)   Resp 20   SpO2 97%   Visual Acuity Right Eye Distance:   Left Eye Distance:   Bilateral Distance:    Right Eye Near:   Left Eye Near:    Bilateral Near:     Physical Exam Vitals and nursing note reviewed.  Constitutional:      Appearance: He is well-developed.  HENT:     Head: Atraumatic.     Right Ear: External ear normal.     Left Ear:  External ear normal.     Nose: Rhinorrhea present.     Mouth/Throat:     Pharynx: Posterior oropharyngeal erythema present. No oropharyngeal exudate.  Eyes:     Conjunctiva/sclera: Conjunctivae normal.     Pupils: Pupils are equal, round, and reactive to light.  Cardiovascular:  Rate and Rhythm: Normal rate and regular rhythm.  Pulmonary:     Effort: Pulmonary effort is normal. No respiratory distress.     Breath sounds: No wheezing or rales.  Musculoskeletal:        General: Normal range of motion.     Cervical back: Normal range of motion and neck supple.  Lymphadenopathy:     Cervical: No cervical adenopathy.  Skin:    General: Skin is warm and dry.  Neurological:     Mental Status: He is alert and oriented to person, place, and time.  Psychiatric:        Behavior: Behavior normal.    UC Treatments / Results  Labs (all labs ordered are listed, but only abnormal results are displayed) Labs Reviewed  POC COVID19/FLU A&B COMBO    EKG   Radiology No results found.  Procedures Procedures (including critical care time)  Medications Ordered in UC Medications - No data to display  Initial Impression / Assessment and Plan / UC Course  I have reviewed the triage vital signs and the nursing notes.  Pertinent labs & imaging results that were available during my care of the patient were reviewed by me and considered in my medical decision making (see chart for details).     Febrile in triage and otherwise vital signs reassuring.  Suspect viral respiratory infection.  Rapid flu and COVID-negative, treat with Phenergan DM, supportive over-the-counter medications and home care.  Return for worsening symptoms.  Final Clinical Impressions(s) / UC Diagnoses   Final diagnoses:  Viral URI with cough  Fever, unspecified     Discharge Instructions      COVID and flu test was negative.  Your exam is reassuring.  I have sent over a good cough syrup and you may take  over-the-counter cold ingestion medications, Flonase, humidifiers, drink plenty of fluids, return for worsening symptoms.    ED Prescriptions     Medication Sig Dispense Auth. Provider   promethazine-dextromethorphan (PROMETHAZINE-DM) 6.25-15 MG/5ML syrup Take 5 mLs by mouth 4 (four) times daily as needed. 100 mL Particia Nearing, [place]      PDMP not reviewed this encounter.   Particia Nearing, [place] 12/04/22 1956

## 2022-12-04 NOTE — Discharge Instructions (Signed)
COVID and flu test was negative.  Your exam is reassuring.  I have sent over a good cough syrup and you may take over-the-counter cold ingestion medications, Flonase, humidifiers, drink plenty of fluids, return for worsening symptoms.

## 2022-12-19 DIAGNOSIS — J019 Acute sinusitis, unspecified: Secondary | ICD-10-CM | POA: Diagnosis not present

## 2022-12-19 DIAGNOSIS — Z6825 Body mass index (BMI) 25.0-25.9, adult: Secondary | ICD-10-CM | POA: Diagnosis not present

## 2022-12-19 DIAGNOSIS — R059 Cough, unspecified: Secondary | ICD-10-CM | POA: Diagnosis not present

## 2023-01-13 ENCOUNTER — Other Ambulatory Visit (HOSPITAL_COMMUNITY): Payer: Self-pay

## 2023-01-13 MED ORDER — ESCITALOPRAM OXALATE 5 MG PO TABS
5.0000 mg | ORAL_TABLET | Freq: Every day | ORAL | 0 refills | Status: AC
  Filled 2023-01-13: qty 30, 30d supply, fill #0

## 2023-09-04 ENCOUNTER — Other Ambulatory Visit (HOSPITAL_COMMUNITY): Payer: Self-pay

## 2023-09-20 ENCOUNTER — Other Ambulatory Visit (HOSPITAL_BASED_OUTPATIENT_CLINIC_OR_DEPARTMENT_OTHER): Payer: Self-pay

## 2023-09-21 ENCOUNTER — Other Ambulatory Visit (HOSPITAL_BASED_OUTPATIENT_CLINIC_OR_DEPARTMENT_OTHER): Payer: Self-pay

## 2023-09-23 ENCOUNTER — Other Ambulatory Visit (HOSPITAL_COMMUNITY): Payer: Self-pay

## 2023-09-23 ENCOUNTER — Other Ambulatory Visit (HOSPITAL_BASED_OUTPATIENT_CLINIC_OR_DEPARTMENT_OTHER): Payer: Self-pay

## 2023-09-23 MED ORDER — CLOBETASOL PROPIONATE 0.05 % EX CREA
TOPICAL_CREAM | CUTANEOUS | 2 refills | Status: AC
  Filled 2023-09-23: qty 45, 14d supply, fill #0

## 2023-12-07 ENCOUNTER — Other Ambulatory Visit (HOSPITAL_BASED_OUTPATIENT_CLINIC_OR_DEPARTMENT_OTHER): Payer: Self-pay

## 2023-12-07 MED ORDER — ALPRAZOLAM 0.25 MG PO TABS
0.2500 mg | ORAL_TABLET | Freq: Every day | ORAL | 0 refills | Status: AC | PRN
  Filled 2023-12-07: qty 2, 2d supply, fill #0
